# Patient Record
Sex: Female | Born: 1960 | Race: White | Hispanic: No | Smoking: Current every day smoker
Health system: Southern US, Community
[De-identification: ages and names within clinical notes are randomized; demographics above are authoritative.]

## PROBLEM LIST (undated history)

## (undated) DIAGNOSIS — J449 Chronic obstructive pulmonary disease, unspecified: Secondary | ICD-10-CM

## (undated) DIAGNOSIS — C801 Malignant (primary) neoplasm, unspecified: Secondary | ICD-10-CM

## (undated) DIAGNOSIS — I10 Essential (primary) hypertension: Secondary | ICD-10-CM

## (undated) HISTORY — PX: CHOLECYSTECTOMY: SHX55

---

## 2003-12-23 ENCOUNTER — Ambulatory Visit (HOSPITAL_COMMUNITY): Admission: RE | Admit: 2003-12-23 | Discharge: 2003-12-23 | Payer: Self-pay | Admitting: Family Medicine

## 2004-01-06 ENCOUNTER — Ambulatory Visit (HOSPITAL_COMMUNITY): Admission: RE | Admit: 2004-01-06 | Discharge: 2004-01-06 | Payer: Self-pay | Admitting: Family Medicine

## 2004-02-19 ENCOUNTER — Other Ambulatory Visit: Admission: RE | Admit: 2004-02-19 | Discharge: 2004-02-19 | Payer: Self-pay | Admitting: Obstetrics and Gynecology

## 2004-08-20 ENCOUNTER — Emergency Department (HOSPITAL_COMMUNITY): Admission: EM | Admit: 2004-08-20 | Discharge: 2004-08-20 | Payer: Self-pay | Admitting: Emergency Medicine

## 2008-02-08 ENCOUNTER — Emergency Department (HOSPITAL_COMMUNITY): Admission: EM | Admit: 2008-02-08 | Discharge: 2008-02-08 | Payer: Self-pay | Admitting: Emergency Medicine

## 2008-07-20 ENCOUNTER — Ambulatory Visit (HOSPITAL_COMMUNITY): Admission: RE | Admit: 2008-07-20 | Discharge: 2008-07-20 | Payer: Self-pay | Admitting: Family Medicine

## 2008-12-02 ENCOUNTER — Ambulatory Visit (HOSPITAL_COMMUNITY): Admission: RE | Admit: 2008-12-02 | Discharge: 2008-12-02 | Payer: Self-pay | Admitting: Family Medicine

## 2010-03-10 ENCOUNTER — Emergency Department (HOSPITAL_COMMUNITY): Admission: EM | Admit: 2010-03-10 | Discharge: 2010-03-11 | Payer: Self-pay | Admitting: Emergency Medicine

## 2010-10-02 ENCOUNTER — Encounter: Payer: Self-pay | Admitting: Family Medicine

## 2011-12-27 ENCOUNTER — Other Ambulatory Visit (HOSPITAL_COMMUNITY): Payer: Self-pay | Admitting: Family Medicine

## 2011-12-27 DIAGNOSIS — Z139 Encounter for screening, unspecified: Secondary | ICD-10-CM

## 2012-01-08 ENCOUNTER — Ambulatory Visit (HOSPITAL_COMMUNITY): Payer: Self-pay

## 2012-01-08 ENCOUNTER — Telehealth: Payer: Self-pay

## 2012-01-08 NOTE — Telephone Encounter (Signed)
LMOM to call.

## 2012-01-09 NOTE — Telephone Encounter (Signed)
LMOM to call.

## 2012-01-11 ENCOUNTER — Ambulatory Visit (HOSPITAL_COMMUNITY)
Admission: RE | Admit: 2012-01-11 | Discharge: 2012-01-11 | Disposition: A | Discharge status: Home / Self Care | Payer: 59 | Source: Ambulatory Visit | Attending: Family Medicine | Admitting: Family Medicine

## 2012-01-11 DIAGNOSIS — Z139 Encounter for screening, unspecified: Secondary | ICD-10-CM

## 2012-01-11 DIAGNOSIS — Z1231 Encounter for screening mammogram for malignant neoplasm of breast: Secondary | ICD-10-CM | POA: Insufficient documentation

## 2012-01-11 NOTE — Telephone Encounter (Signed)
Mailing a letter for pt to call. Letter to PCP also.

## 2015-01-27 ENCOUNTER — Ambulatory Visit (HOSPITAL_COMMUNITY)
Admission: RE | Admit: 2015-01-27 | Discharge: 2015-01-27 | Disposition: A | Discharge status: Home / Self Care | Payer: 59 | Source: Ambulatory Visit | Attending: Family Medicine | Admitting: Family Medicine

## 2015-01-27 ENCOUNTER — Other Ambulatory Visit (HOSPITAL_COMMUNITY): Payer: Self-pay | Admitting: Family Medicine

## 2015-01-27 DIAGNOSIS — M25511 Pain in right shoulder: Secondary | ICD-10-CM

## 2015-02-23 ENCOUNTER — Encounter: Payer: Self-pay | Admitting: *Deleted

## 2015-02-23 ENCOUNTER — Ambulatory Visit (INDEPENDENT_AMBULATORY_CARE_PROVIDER_SITE_OTHER): Payer: 59

## 2015-02-23 ENCOUNTER — Telehealth: Payer: Self-pay | Admitting: *Deleted

## 2015-02-23 ENCOUNTER — Other Ambulatory Visit: Payer: Self-pay | Admitting: *Deleted

## 2015-02-23 ENCOUNTER — Ambulatory Visit (INDEPENDENT_AMBULATORY_CARE_PROVIDER_SITE_OTHER): Payer: 59 | Admitting: Orthopedic Surgery

## 2015-02-23 VITALS — BP 162/88 | Ht 68.0 in | Wt 165.0 lb

## 2015-02-23 DIAGNOSIS — M542 Cervicalgia: Secondary | ICD-10-CM

## 2015-02-23 DIAGNOSIS — M25511 Pain in right shoulder: Secondary | ICD-10-CM | POA: Diagnosis not present

## 2015-02-23 MED ORDER — GABAPENTIN 100 MG PO CAPS: 100.0000 mg | ORAL_CAPSULE | Freq: Three times a day (TID) | ORAL | Status: DC

## 2015-02-23 MED ORDER — PREDNISONE 10 MG (48) PO TBPK: ORAL_TABLET | ORAL | Status: DC

## 2015-02-23 NOTE — Progress Notes (Signed)
The patient  Referred from [facility]  Pharmacy [facility]  Complaint right shoulder pain, throbbing 5 months  Injury history no injury  The patient does report a history of right shoulder pain treated with subacromial injection a proximally 10 years ago. She recovered easily and did well up until 5 months prior. 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 She has no weakness in the arm.  Review of systems night sweats vision problems anxiety joint pain  No allergies  Family history of heart disease and arthritis  She does smoke 1 pack per day she does drink maybe 2 drinks per week social history already described  Past medical history depression arthritis  The only operation is a cholecystectomy in 1989  Her medications are alprazolam hydrocodone and Centrum Silver.  She had an x-ray of her right shoulder 3 views that [facility] in those x-rays were normal with the exception of some sclerosis over the greater tuberosity.  That x-ray has been independently reviewed and I am reviewing that as normal  Our exam findings are as follows  Vital signs are: BP 162/88 mmHg  Ht 5\' 8"  (1.727 m)  Wt 165 lb (74.844 kg)  BMI 25.09 kg/m2  She has a small body habitus she is normally groomed hygiene is normal as well She is oriented 3 Normal mood some frustration from the constant pain and lack of relief  Ambulatory status is normal  Left and right shoulder have equal and normal strength and normal range of motion Right shoulder is stable There is no tenderness in the shoulder but her right trapezius muscle is tender and her cervical spine is tender as is her suprascapular fossa Skin in the head and neck area and the shoulder area and right upper extremity normal She has no sensory deficits normal sensation and no palpable  lymph nodes in the right axilla or supraclavicular region  Impression  It would be unlikely with this level of adequate treatment for bursitis or cuff syndrome that she would not get better, she will have a neck x-ray and then I will reinject the shoulder just to make sure we got a good injection and then start her on a Dosepak and some gabapentin  Cervical spine x-rays do so C5-6 C6-7 degenerative disc disease with joint space narrowing and anterior osteophytes  I think her problem is actually coming from her neck with cervical spondylosis  We will proceed with a Dosepak and gabapentin I did reinject the shoulder just to make sure I got a very good injection  Right subacromial injection  Procedure note the subacromial injection shoulder RIGHT  Verbal consent was obtained to inject the  RIGHT   Shoulder  Timeout was completed to confirm the injection site is a subacromial space of the  RIGHT  shoulder   Medication used Depo-Medrol 40 mg and lidocaine 1% 3 cc  Anesthesia was provided by ethyl chloride  The injection was performed in the RIGHT  posterior subacromial space. After pinning the skin with alcohol and anesthetized the skin with ethyl chloride the subacromial space was injected using a 20-gauge needle. There were no complications  Sterile dressing was applied.    I would discourage the use of Norco and opioids  in this setting.

## 2015-02-23 NOTE — Telephone Encounter (Signed)
This encounter was created in error - please disregard.

## 2015-02-23 NOTE — Telephone Encounter (Signed)
Requesting work note that states no heavy lifting until next appointment  Is this ok?

## 2015-02-23 NOTE — Patient Instructions (Signed)
Two new meds sent to your pharmacy  

## 2015-02-24 ENCOUNTER — Encounter: Payer: Self-pay | Admitting: Orthopedic Surgery

## 2015-04-20 ENCOUNTER — Ambulatory Visit (INDEPENDENT_AMBULATORY_CARE_PROVIDER_SITE_OTHER): Payer: 59 | Admitting: Orthopedic Surgery

## 2015-04-20 VITALS — BP 149/86 | Ht 68.0 in | Wt 172.0 lb

## 2015-04-20 DIAGNOSIS — M25511 Pain in right shoulder: Secondary | ICD-10-CM

## 2015-04-20 DIAGNOSIS — M75101 Unspecified rotator cuff tear or rupture of right shoulder, not specified as traumatic: Secondary | ICD-10-CM | POA: Diagnosis not present

## 2015-04-20 NOTE — Patient Instructions (Signed)
Joint Injection  Care After  Refer to this sheet in the next few days. These instructions provide you with information on caring for yourself after you have had a joint injection. Your caregiver also may give you more specific instructions. Your treatment has been planned according to current medical practices, but problems sometimes occur. Call your caregiver if you have any problems or questions after your procedure.  After any type of joint injection, it is not uncommon to experience:  · Soreness, swelling, or bruising around the injection site.  · Mild numbness, tingling, or weakness around the injection site caused by the numbing medicine used before or with the injection.  It also is possible to experience the following effects associated with the specific agent after injection:  · Iodine-based contrast agents:  ¨ Allergic reaction (itching, hives, widespread redness, and swelling beyond the injection site).  · Corticosteroids (These effects are rare.):  ¨ Allergic reaction.  ¨ Increased blood sugar levels (If you have diabetes and you notice that your blood sugar levels have increased, notify your caregiver).  ¨ Increased blood pressure levels.  ¨ Mood swings.  · Hyaluronic acid in the use of viscosupplementation.  ¨ Temporary heat or redness.  ¨ Temporary rash and itching.  ¨ Increased fluid accumulation in the injected joint.  These effects all should resolve within a day after your procedure.   HOME CARE INSTRUCTIONS  · Limit yourself to light activity the day of your procedure. Avoid lifting heavy objects, bending, stooping, or twisting.  · Take prescription or over-the-counter pain medication as directed by your caregiver.  · You may apply ice to your injection site to reduce pain and swelling the day of your procedure. Ice may be applied 03-04 times:  ¨ Put ice in a plastic bag.  ¨ Place a towel between your skin and the bag.  ¨ Leave the ice on for no longer than 15-20 minutes each time.  SEEK  IMMEDIATE MEDICAL CARE IF:   · Pain and swelling get worse rather than better or extend beyond the injection site.  · Numbness does not go away.  · Blood or fluid continues to leak from the injection site.  · You have chest pain.  · You have swelling of your face or tongue.  · You have trouble breathing or you become dizzy.  · You develop a fever, chills, or severe tenderness at the injection site that last longer than 1 day.  MAKE SURE YOU:  · Understand these instructions.  · Watch your condition.  · Get help right away if you are not doing well or if you get worse.  Document Released: 05/11/2011 Document Revised: 11/20/2011 Document Reviewed: 05/11/2011  ExitCare® Patient Information ©2015 [employer]. This information is not intended to replace advice given to you by your health care provider. Make sure you discuss any questions you have with your health care provider.

## 2015-04-20 NOTE — Progress Notes (Signed)
Patient ID: Tara Morgan, female   DOB: Sep 14, 1960, 54 y.o.   MRN: [id number]  Follow up visit  Chief Complaint  Patient presents with  . Follow-up    6 week follow up right shoulder s/p injection + meds    BP 149/86 mmHg  Ht  (1.727 m)  Wt 172 lb (78.019 kg)  BMI 26.16 kg/m2  No diagnosis found.  Status post injection for rotator cuff syndrome patient says she got 3 weeks ago pain relief and then the pain started to come back but she has improved  Review of systems denies neck pain denies radicular symptoms denies numbness or tingling  Vital signs are stable as recorded she is oriented 3 mood and affect are normal she is well-groomed well-developed well-nourished no deformity  She has good strength in her rotator cuff no instability impingement sign is mild  Neurovascular exam intact  Rotator cuff syndrome  Recommend second injection in home exercise program  Follow-up 3 months   Procedure note the subacromial injection shoulder RIGHT  Verbal consent was obtained to inject the  RIGHT   Shoulder  Timeout was completed to confirm the injection site is a subacromial space of the  RIGHT  shoulder   Medication used Depo-Medrol 40 mg and lidocaine 1% 3 cc  Anesthesia was provided by ethyl chloride  The injection was performed in the RIGHT  posterior subacromial space. After pinning the skin with alcohol and anesthetized the skin with ethyl chloride the subacromial space was injected using a 20-gauge needle. There were no complications  Sterile dressing was applied.

## 2015-07-22 ENCOUNTER — Ambulatory Visit: Payer: 59 | Admitting: Orthopedic Surgery

## 2015-11-09 DIAGNOSIS — H52223 Regular astigmatism, bilateral: Secondary | ICD-10-CM | POA: Diagnosis not present

## 2015-11-09 DIAGNOSIS — H5203 Hypermetropia, bilateral: Secondary | ICD-10-CM | POA: Diagnosis not present

## 2015-11-09 DIAGNOSIS — H524 Presbyopia: Secondary | ICD-10-CM | POA: Diagnosis not present

## 2015-12-09 DIAGNOSIS — H25091 Other age-related incipient cataract, right eye: Secondary | ICD-10-CM | POA: Diagnosis not present

## 2015-12-09 DIAGNOSIS — H25812 Combined forms of age-related cataract, left eye: Secondary | ICD-10-CM | POA: Diagnosis not present

## 2015-12-09 DIAGNOSIS — H5203 Hypermetropia, bilateral: Secondary | ICD-10-CM | POA: Diagnosis not present

## 2015-12-29 NOTE — Patient Instructions (Signed)
Your procedure is scheduled on: 01/03/2016  Report to [facility] at 730  AM.  Call this number if you have problems the morning of surgery: [phone]   Do not eat food or drink liquids :After Midnight.      Take these medicines the morning of surgery with A SIP OF WATER: xanax   Do not wear jewelry, make-up or nail polish.  Do not wear lotions, powders, or perfumes. You may wear deodorant.  Do not shave 48 hours prior to surgery.  Do not bring valuables to the hospital.  Contacts, dentures or bridgework may not be worn into surgery.  Leave suitcase in the car. After surgery it may be brought to your room.  For patients admitted to the hospital, checkout time is 11:00 AM the day of discharge.   Patients discharged the day of surgery will not be allowed to drive home.  :     Please read over the following fact sheets that you were given: Coughing and Deep Breathing, Surgical Site Infection Prevention, Anesthesia Post-op Instructions and Care and Recovery After Surgery    Cataract A cataract is a clouding of the lens of the eye. When a lens becomes cloudy, vision is reduced based on the degree and nature of the clouding. Many cataracts reduce vision to some degree. Some cataracts make people more near-sighted as they develop. Other cataracts increase glare. Cataracts that are ignored and become worse can sometimes look white. The white color can be seen through the pupil. CAUSES   Aging. However, cataracts may occur at any age, even in newborns.   Certain drugs.   Trauma to the eye.   Certain diseases such as diabetes.   Specific eye diseases such as chronic inflammation inside the eye or a sudden attack of a rare form of glaucoma.   Inherited or acquired medical problems.  SYMPTOMS   Gradual, progressive drop in vision in the affected eye.   Severe, rapid visual loss. This most often happens when trauma is the cause.  DIAGNOSIS  To detect a cataract, an eye doctor examines  the lens. Cataracts are best diagnosed with an exam of the eyes with the pupils enlarged (dilated) by drops.  TREATMENT  For an early cataract, vision may improve by using different eyeglasses or stronger lighting. If that does not help your vision, surgery is the only effective treatment. A cataract needs to be surgically removed when vision loss interferes with your everyday activities, such as driving, reading, or watching TV. A cataract may also have to be removed if it prevents examination or treatment of another eye problem. Surgery removes the cloudy lens and usually replaces it with a substitute lens (intraocular lens, IOL).  At a time when both you and your doctor agree, the cataract will be surgically removed. If you have cataracts in both eyes, only one is usually removed at a time. This allows the operated eye to heal and be out of danger from any possible problems after surgery (such as infection or poor wound healing). In rare cases, a cataract may be doing damage to your eye. In these cases, your caregiver may advise surgical removal right away. The vast majority of people who have cataract surgery have better vision afterward. HOME CARE INSTRUCTIONS  If you are not planning surgery, you may be asked to do the following:  Use different eyeglasses.   Use stronger or brighter lighting.   Ask your eye doctor about reducing your medicine dose or changing  medicines if it is thought that a medicine caused your cataract. Changing medicines does not make the cataract go away on its own.   Become familiar with your surroundings. Poor vision can lead to injury. Avoid bumping into things on the affected side. You are at a higher risk for tripping or falling.   Exercise extreme care when driving or operating machinery.   Wear sunglasses if you are sensitive to bright light or experiencing problems with glare.  SEEK IMMEDIATE MEDICAL CARE IF:   You have a worsening or sudden vision loss.    You notice redness, swelling, or increasing pain in the eye.   You have a fever.  Document Released: 08/28/2005 Document Revised: 08/17/2011 Document Reviewed: 04/21/2011 [facility] Patient Information 2012 [place].PATIENT INSTRUCTIONS POST-ANESTHESIA  IMMEDIATELY FOLLOWING SURGERY:  Do not drive or operate machinery for the first twenty four hours after surgery.  Do not make any important decisions for twenty four hours after surgery or while taking narcotic pain medications or sedatives.  If you develop intractable nausea and vomiting or a severe headache please notify your doctor immediately.  FOLLOW-UP:  Please make an appointment with your surgeon as instructed. You do not need to follow up with anesthesia unless specifically instructed to do so.  WOUND CARE INSTRUCTIONS (if applicable):  Keep a dry clean dressing on the anesthesia/puncture wound site if there is drainage.  Once the wound has quit draining you may leave it open to air.  Generally you should leave the bandage intact for twenty four hours unless there is drainage.  If the epidural site drains for more than 36-48 hours please call the anesthesia department.  QUESTIONS?:  Please feel free to call your physician or the hospital operator if you have any questions, and they will be happy to assist you.

## 2015-12-30 ENCOUNTER — Encounter (HOSPITAL_COMMUNITY)
Admission: RE | Admit: 2015-12-30 | Discharge: 2015-12-30 | Disposition: A | Discharge status: Home / Self Care | Payer: 59 | Source: Ambulatory Visit | Attending: Ophthalmology | Admitting: Ophthalmology

## 2015-12-30 ENCOUNTER — Encounter (HOSPITAL_COMMUNITY): Payer: Self-pay

## 2015-12-30 DIAGNOSIS — Z01812 Encounter for preprocedural laboratory examination: Secondary | ICD-10-CM | POA: Insufficient documentation

## 2015-12-30 LAB — BASIC METABOLIC PANEL
Anion gap: 9 (ref 5–15)
BUN: 12 mg/dL (ref 6–20)
CHLORIDE: 108 mmol/L (ref 101–111)
CO2: 23 mmol/L (ref 22–32)
Calcium: 9.3 mg/dL (ref 8.9–10.3)
Creatinine, Ser: 0.72 mg/dL (ref 0.44–1.00)
GFR calc Af Amer: >60 mL/min (ref >60)
GFR calc non Af Amer: >60 mL/min (ref >60)
Glucose, Bld: 140 mg/dL — ABNORMAL HIGH (ref 65–99)
POTASSIUM: 3.8 mmol/L (ref 3.5–5.1)
SODIUM: 140 mmol/L (ref 135–145)

## 2015-12-30 LAB — CBC WITH DIFFERENTIAL/PLATELET
Basophils Absolute: 0 10*3/uL (ref 0.0–0.1)
Basophils Relative: 0 %
EOS ABS: 0.1 10*3/uL (ref 0.0–0.7)
Eosinophils Relative: 2 %
HEMATOCRIT: 39.4 % (ref 36.0–46.0)
HEMOGLOBIN: 13.6 g/dL (ref 12.0–15.0)
LYMPHS ABS: 2.1 10*3/uL (ref 0.7–4.0)
LYMPHS PCT: 37 %
MCH: 36 pg — AB (ref 26.0–34.0)
MCHC: 34.5 g/dL (ref 30.0–36.0)
MCV: 104.2 fL — ABNORMAL HIGH (ref 78.0–100.0)
MONOS PCT: 5 %
Monocytes Absolute: 0.3 10*3/uL (ref 0.1–1.0)
NEUTROS ABS: 3.2 10*3/uL (ref 1.7–7.7)
NEUTROS PCT: 56 %
Platelets: 234 10*3/uL (ref 150–400)
RBC: 3.78 MIL/uL — AB (ref 3.87–5.11)
RDW: 13.7 % (ref 11.5–15.5)
WBC: 5.7 10*3/uL (ref 4.0–10.5)

## 2015-12-30 NOTE — Pre-Procedure Instructions (Signed)
Patient given information to sign up for my chart at home. 

## 2016-01-03 ENCOUNTER — Ambulatory Visit (HOSPITAL_COMMUNITY): Payer: 59 | Admitting: Anesthesiology

## 2016-01-03 ENCOUNTER — Ambulatory Visit (HOSPITAL_COMMUNITY)
Admission: RE | Admit: 2016-01-03 | Discharge: 2016-01-03 | Disposition: A | Discharge status: Home / Self Care | Payer: 59 | Source: Ambulatory Visit | Attending: Ophthalmology | Admitting: Ophthalmology

## 2016-01-03 ENCOUNTER — Encounter (HOSPITAL_COMMUNITY): Payer: Self-pay

## 2016-01-03 ENCOUNTER — Encounter (HOSPITAL_COMMUNITY)
Admission: RE | Disposition: A | Discharge status: Home / Self Care | Payer: Self-pay | Source: Ambulatory Visit | Attending: Ophthalmology

## 2016-01-03 DIAGNOSIS — H25812 Combined forms of age-related cataract, left eye: Secondary | ICD-10-CM | POA: Diagnosis not present

## 2016-01-03 DIAGNOSIS — M1991 Primary osteoarthritis, unspecified site: Secondary | ICD-10-CM | POA: Insufficient documentation

## 2016-01-03 DIAGNOSIS — H269 Unspecified cataract: Secondary | ICD-10-CM | POA: Diagnosis not present

## 2016-01-03 DIAGNOSIS — F17200 Nicotine dependence, unspecified, uncomplicated: Secondary | ICD-10-CM | POA: Insufficient documentation

## 2016-01-03 HISTORY — PX: CATARACT EXTRACTION W/PHACO: SHX586

## 2016-01-03 SURGERY — PHACOEMULSIFICATION, CATARACT, WITH IOL INSERTION
Anesthesia: Monitor Anesthesia Care | Site: Eye | Laterality: Left

## 2016-01-03 MED ORDER — POVIDONE-IODINE 5 % OP SOLN
OPHTHALMIC | Status: DC | PRN
  Administered 2016-01-03: 1 via OPHTHALMIC

## 2016-01-03 MED ORDER — NEOMYCIN-POLYMYXIN-DEXAMETH 3.5-10000-0.1 OP SUSP
OPHTHALMIC | Status: DC | PRN
  Administered 2016-01-03: 2 [drp] via OPHTHALMIC

## 2016-01-03 MED ORDER — EPINEPHRINE HCL 1 MG/ML IJ SOLN
INTRAMUSCULAR | Status: AC
  Filled 2016-01-03: qty 1

## 2016-01-03 MED ORDER — PHENYLEPHRINE HCL 2.5 % OP SOLN
1.0000 [drp] | OPHTHALMIC | Status: AC
  Administered 2016-01-03 (×3): 1 [drp] via OPHTHALMIC

## 2016-01-03 MED ORDER — MIDAZOLAM HCL 2 MG/2ML IJ SOLN
INTRAMUSCULAR | Status: AC
  Filled 2016-01-03: qty 2

## 2016-01-03 MED ORDER — PROVISC 10 MG/ML IO SOLN
INTRAOCULAR | Status: DC | PRN
  Administered 2016-01-03: 0.85 mL via INTRAOCULAR

## 2016-01-03 MED ORDER — LIDOCAINE 3.5 % OP GEL OPTIME - NO CHARGE
OPHTHALMIC | Status: DC | PRN
  Administered 2016-01-03: 1 [drp] via OPHTHALMIC

## 2016-01-03 MED ORDER — MIDAZOLAM HCL 2 MG/2ML IJ SOLN
1.0000 mg | INTRAMUSCULAR | Status: DC | PRN
  Administered 2016-01-03 (×2): 2 mg via INTRAVENOUS
  Filled 2016-01-03: qty 2

## 2016-01-03 MED ORDER — FENTANYL CITRATE (PF) 100 MCG/2ML IJ SOLN
25.0000 ug | INTRAMUSCULAR | Status: AC
  Administered 2016-01-03 (×2): 25 ug via INTRAVENOUS

## 2016-01-03 MED ORDER — CYCLOPENTOLATE-PHENYLEPHRINE 0.2-1 % OP SOLN
1.0000 [drp] | OPHTHALMIC | Status: AC
  Administered 2016-01-03 (×3): 1 [drp] via OPHTHALMIC

## 2016-01-03 MED ORDER — LACTATED RINGERS IV SOLN
INTRAVENOUS | Status: DC
  Administered 2016-01-03: 08:00:00 via INTRAVENOUS

## 2016-01-03 MED ORDER — LIDOCAINE HCL (PF) 1 % IJ SOLN
INTRAMUSCULAR | Status: DC | PRN
  Administered 2016-01-03: .5 mL

## 2016-01-03 MED ORDER — BSS IO SOLN
INTRAOCULAR | Status: DC | PRN
  Administered 2016-01-03: 15 mL

## 2016-01-03 MED ORDER — LIDOCAINE HCL 3.5 % OP GEL: 1.0000 "application " | Freq: Once | OPHTHALMIC | Status: DC

## 2016-01-03 MED ORDER — FENTANYL CITRATE (PF) 100 MCG/2ML IJ SOLN
INTRAMUSCULAR | Status: AC
  Filled 2016-01-03: qty 2

## 2016-01-03 MED ORDER — EPINEPHRINE HCL 1 MG/ML IJ SOLN
INTRAMUSCULAR | Status: DC | PRN
  Administered 2016-01-03: 500 mL

## 2016-01-03 MED ORDER — TETRACAINE HCL 0.5 % OP SOLN
1.0000 [drp] | OPHTHALMIC | Status: AC
  Administered 2016-01-03 (×3): 1 [drp] via OPHTHALMIC

## 2016-01-03 SURGICAL SUPPLY — 11 items
CLOTH BEACON ORANGE TIMEOUT ST (SAFETY) ×1 IMPLANT
EYE SHIELD UNIVERSAL CLEAR (GAUZE/BANDAGES/DRESSINGS) ×1 IMPLANT
GLOVE BIOGEL PI IND STRL 7.0 (GLOVE) IMPLANT
GLOVE BIOGEL PI INDICATOR 7.0 (GLOVE) ×1
GLOVE EXAM NITRILE MD LF STRL (GLOVE) ×1 IMPLANT
PAD ARMBOARD 7.5X6 YLW CONV (MISCELLANEOUS) ×1 IMPLANT
SIGHTPATH CAT PROC W REG LENS (Ophthalmic Related) ×2 IMPLANT
SYRINGE LUER LOK 1CC (MISCELLANEOUS) ×1 IMPLANT
TAPE SURG TRANSPORE 1 IN (GAUZE/BANDAGES/DRESSINGS) IMPLANT
TAPE SURGICAL TRANSPORE 1 IN (GAUZE/BANDAGES/DRESSINGS) ×1
WATER STERILE IRR 250ML POUR (IV SOLUTION) ×1 IMPLANT

## 2016-01-03 NOTE — Anesthesia Postprocedure Evaluation (Signed)
Anesthesia Post Note  Patient: Tara Morgan  Procedure(s) Performed: Procedure(s) (LRB): CATARACT EXTRACTION PHACO AND INTRAOCULAR LENS PLACEMENT (IOC) (Left)  Patient location during evaluation: Short Stay Anesthesia Type: MAC Level of consciousness: awake and alert and oriented Pain management: pain level controlled Vital Signs Assessment: post-procedure vital signs reviewed and stable Respiratory status: spontaneous breathing Postop Assessment: no signs of nausea or vomiting Anesthetic complications: no    Last Vitals:  Filed Vitals:   01/03/16 0738 01/03/16 0741  BP:  184/86  Pulse:  55  Temp: 36.7 C   Resp:  16    Last Pain: There were no vitals filed for this visit.               Nigil Braman A

## 2016-01-03 NOTE — H&P (Signed)
I have reviewed the H&P, the patient was re-examined, and I have identified no interval changes in medical condition and plan of care since the history and physical of record  

## 2016-01-03 NOTE — Anesthesia Preprocedure Evaluation (Signed)
Anesthesia Evaluation  Patient identified by MRN, date of birth, ID band Patient awake    Reviewed: Allergy & Precautions, NPO status , Patient's Chart, lab work & pertinent test results  Airway Mallampati: II  TM Distance: >3 FB Neck ROM: Full    Dental  (+) Edentulous Upper   Pulmonary Current Smoker,    breath sounds clear to auscultation       Cardiovascular negative cardio ROS   Rhythm:Regular Rate:Normal     Neuro/Psych    GI/Hepatic negative GI ROS,   Endo/Other    Renal/GU      Musculoskeletal   Abdominal   Peds  Hematology   Anesthesia Other Findings   Reproductive/Obstetrics                             Anesthesia Physical Anesthesia Plan  ASA: II  Anesthesia Plan: MAC   Post-op Pain Management:    Induction: Intravenous  Airway Management Planned: Nasal Cannula  Additional Equipment:   Intra-op Plan:   Post-operative Plan:   Informed Consent: I have reviewed the patients History and Physical, chart, labs and discussed the procedure including the risks, benefits and alternatives for the proposed anesthesia with the patient or authorized representative who has indicated his/her understanding and acceptance.     Plan Discussed with:   Anesthesia Plan Comments:         Anesthesia Quick Evaluation

## 2016-01-03 NOTE — Anesthesia Procedure Notes (Signed)
Procedure Name: MAC Date/Time: 01/03/2016 9:04 AM Performed by: Pernell DupreADAMS, Tara Mantione A Pre-anesthesia Checklist: Patient identified, Timeout performed, Emergency Drugs available, Suction available and Patient being monitored Patient Re-evaluated:Patient Re-evaluated prior to inductionOxygen Delivery Method: Nasal cannula

## 2016-01-03 NOTE — Transfer of Care (Signed)
Immediate Anesthesia Transfer of Care Note  Patient: Tara HomesMary K Noga  Procedure(s) Performed: Procedure(s) with comments: CATARACT EXTRACTION PHACO AND INTRAOCULAR LENS PLACEMENT (IOC) (Left) - CDE:5.86  Patient Location: Short stay  Anesthesia Type:MAC  Level of Consciousness: awake, alert , oriented and patient cooperative  Airway & Oxygen Therapy: Patient Spontanous Breathing  Post-op Assessment: Report given to RN and Post -op Vital signs reviewed and stable  Post vital signs: Reviewed and stable  Last Vitals:  Filed Vitals:   01/03/16 0738 01/03/16 0741  BP:  184/86  Pulse:  55  Temp: 36.7 C   Resp:  16    Complications: No apparent anesthesia complications

## 2016-01-03 NOTE — Op Note (Signed)
Date of Admission: 01/03/2016  Date of Surgery: 01/03/2016   Pre-Op Dx: Cataract Left Eye  Post-Op Dx: Senile Combined Cataract Left  Eye,  Dx Code Z61.096H25.812  Surgeon: Gemma PayorKerry Aela Bohan, M.D.  Assistants: None  Anesthesia: Topical with MAC  Indications: Painless, progressive loss of vision with compromise of daily activities.  Surgery: Cataract Extraction with Intraocular lens Implant Left Eye  Discription: The patient had dilating drops and viscous lidocaine placed into the Left eye in the pre-op holding area. After transfer to the operating room, a time out was performed. The patient was then prepped and draped. Beginning with a 75 degree blade a paracentesis port was made at the surgeon's 2 o'clock position. The anterior chamber was then filled with 1% non-preserved lidocaine. This was followed by filling the anterior chamber with Provisc.  A 2.374mm keratome blade was used to make a clear corneal incision at the temporal limbus.  A bent cystatome needle was used to create a continuous tear capsulotomy. Hydrodissection was performed with balanced salt solution on a Fine canula. The lens nucleus was then removed using the phacoemulsification handpiece. Residual cortex was removed with the I&A handpiece. The anterior chamber and capsular bag were refilled with Provisc. A posterior chamber intraocular lens was placed into the capsular bag with it's injector. The implant was positioned with the Kuglan hook. The Provisc was then removed from the anterior chamber and capsular bag with the I&A handpiece. Stromal hydration of the main incision and paracentesis port was performed with BSS on a Fine canula. The wounds were tested for leak which was negative. The patient tolerated the procedure well. There were no operative complications. The patient was then transferred to the recovery room in stable condition.  Complications: None  Specimen: None  EBL: None  Prosthetic device: Hoya iSert 250, power 23.0 D, SN  [id number].

## 2016-01-04 ENCOUNTER — Encounter (HOSPITAL_COMMUNITY): Payer: Self-pay | Admitting: Ophthalmology

## 2016-01-31 DIAGNOSIS — H2511 Age-related nuclear cataract, right eye: Secondary | ICD-10-CM | POA: Diagnosis not present

## 2016-01-31 DIAGNOSIS — H5203 Hypermetropia, bilateral: Secondary | ICD-10-CM | POA: Diagnosis not present

## 2016-01-31 DIAGNOSIS — Z961 Presence of intraocular lens: Secondary | ICD-10-CM | POA: Diagnosis not present

## 2016-02-15 ENCOUNTER — Encounter (HOSPITAL_COMMUNITY): Payer: Self-pay

## 2016-02-16 ENCOUNTER — Encounter (HOSPITAL_COMMUNITY)
Admission: RE | Admit: 2016-02-16 | Discharge: 2016-02-16 | Disposition: A | Discharge status: Home / Self Care | Payer: 59 | Source: Ambulatory Visit | Attending: Ophthalmology | Admitting: Ophthalmology

## 2016-02-21 ENCOUNTER — Encounter (HOSPITAL_COMMUNITY)
Admission: RE | Disposition: A | Discharge status: Home / Self Care | Payer: Self-pay | Source: Ambulatory Visit | Attending: Ophthalmology

## 2016-02-21 ENCOUNTER — Ambulatory Visit (HOSPITAL_COMMUNITY)
Admission: RE | Admit: 2016-02-21 | Discharge: 2016-02-21 | Disposition: A | Discharge status: Home / Self Care | Payer: 59 | Source: Ambulatory Visit | Attending: Ophthalmology | Admitting: Ophthalmology

## 2016-02-21 ENCOUNTER — Ambulatory Visit (HOSPITAL_COMMUNITY): Payer: 59 | Admitting: Anesthesiology

## 2016-02-21 ENCOUNTER — Encounter (HOSPITAL_COMMUNITY): Payer: Self-pay | Admitting: *Deleted

## 2016-02-21 DIAGNOSIS — F17200 Nicotine dependence, unspecified, uncomplicated: Secondary | ICD-10-CM | POA: Diagnosis not present

## 2016-02-21 DIAGNOSIS — H269 Unspecified cataract: Secondary | ICD-10-CM | POA: Diagnosis not present

## 2016-02-21 DIAGNOSIS — H2511 Age-related nuclear cataract, right eye: Secondary | ICD-10-CM | POA: Insufficient documentation

## 2016-02-21 HISTORY — PX: CATARACT EXTRACTION W/PHACO: SHX586

## 2016-02-21 SURGERY — PHACOEMULSIFICATION, CATARACT, WITH IOL INSERTION
Anesthesia: Monitor Anesthesia Care | Site: Eye | Laterality: Right

## 2016-02-21 MED ORDER — LACTATED RINGERS IV SOLN
INTRAVENOUS | Status: DC
  Administered 2016-02-21: 07:00:00 via INTRAVENOUS

## 2016-02-21 MED ORDER — LIDOCAINE HCL 3.5 % OP GEL
1.0000 "application " | Freq: Once | OPHTHALMIC | Status: AC
  Administered 2016-02-21: 1 via OPHTHALMIC

## 2016-02-21 MED ORDER — CYCLOPENTOLATE-PHENYLEPHRINE 0.2-1 % OP SOLN
1.0000 [drp] | OPHTHALMIC | Status: AC
  Administered 2016-02-21 (×3): 1 [drp] via OPHTHALMIC

## 2016-02-21 MED ORDER — FENTANYL CITRATE (PF) 100 MCG/2ML IJ SOLN
25.0000 ug | INTRAMUSCULAR | Status: AC | PRN
  Administered 2016-02-21 (×2): 25 ug via INTRAVENOUS

## 2016-02-21 MED ORDER — POVIDONE-IODINE 5 % OP SOLN
OPHTHALMIC | Status: DC | PRN
  Administered 2016-02-21: 1 via OPHTHALMIC

## 2016-02-21 MED ORDER — FENTANYL CITRATE (PF) 100 MCG/2ML IJ SOLN
INTRAMUSCULAR | Status: AC
  Filled 2016-02-21: qty 2

## 2016-02-21 MED ORDER — PROVISC 10 MG/ML IO SOLN
INTRAOCULAR | Status: DC | PRN
  Administered 2016-02-21: 0.85 mL via INTRAOCULAR

## 2016-02-21 MED ORDER — EPINEPHRINE HCL 1 MG/ML IJ SOLN
INTRAOCULAR | Status: DC | PRN
  Administered 2016-02-21: 500 mL

## 2016-02-21 MED ORDER — PHENYLEPHRINE HCL 2.5 % OP SOLN
1.0000 [drp] | OPHTHALMIC | Status: AC
Start: 2016-02-21 — End: 2016-02-21
  Administered 2016-02-21 (×3): 1 [drp] via OPHTHALMIC

## 2016-02-21 MED ORDER — NEOMYCIN-POLYMYXIN-DEXAMETH 3.5-10000-0.1 OP SUSP
OPHTHALMIC | Status: DC | PRN
  Administered 2016-02-21: 1 [drp] via OPHTHALMIC

## 2016-02-21 MED ORDER — LIDOCAINE HCL (PF) 1 % IJ SOLN
INTRAMUSCULAR | Status: DC | PRN
  Administered 2016-02-21: .3 mL

## 2016-02-21 MED ORDER — BSS IO SOLN
INTRAOCULAR | Status: DC | PRN
  Administered 2016-02-21: 15 mL via INTRAOCULAR

## 2016-02-21 MED ORDER — TETRACAINE HCL 0.5 % OP SOLN
1.0000 [drp] | OPHTHALMIC | Status: AC
  Administered 2016-02-21 (×3): 1 [drp] via OPHTHALMIC

## 2016-02-21 MED ORDER — EPINEPHRINE HCL 1 MG/ML IJ SOLN
INTRAMUSCULAR | Status: AC
  Filled 2016-02-21: qty 1

## 2016-02-21 MED ORDER — MIDAZOLAM HCL 2 MG/2ML IJ SOLN
INTRAMUSCULAR | Status: AC
  Filled 2016-02-21: qty 2

## 2016-02-21 MED ORDER — MIDAZOLAM HCL 2 MG/2ML IJ SOLN
1.0000 mg | INTRAMUSCULAR | Status: DC | PRN
  Administered 2016-02-21 (×2): 2 mg via INTRAVENOUS
  Filled 2016-02-21: qty 2

## 2016-02-21 MED ORDER — LIDOCAINE 3.5 % OP GEL OPTIME - NO CHARGE
OPHTHALMIC | Status: DC | PRN
  Administered 2016-02-21: 1 [drp] via OPHTHALMIC

## 2016-02-21 SURGICAL SUPPLY — 23 items
CAPSULAR TENSION RING-AMO (OPHTHALMIC RELATED) IMPLANT
CLOTH BEACON ORANGE TIMEOUT ST (SAFETY) ×1 IMPLANT
EYE SHIELD UNIVERSAL CLEAR (GAUZE/BANDAGES/DRESSINGS) ×1 IMPLANT
GLOVE BIOGEL PI IND STRL 7.0 (GLOVE) IMPLANT
GLOVE BIOGEL PI IND STRL 7.5 (GLOVE) IMPLANT
GLOVE BIOGEL PI INDICATOR 7.0 (GLOVE) ×1
GLOVE BIOGEL PI INDICATOR 7.5 (GLOVE)
GLOVE EXAM NITRILE LRG STRL (GLOVE) IMPLANT
GLOVE EXAM NITRILE MD LF STRL (GLOVE) ×1 IMPLANT
KIT VITRECTOMY (OPHTHALMIC RELATED) IMPLANT
PAD ARMBOARD 7.5X6 YLW CONV (MISCELLANEOUS) ×1 IMPLANT
PROC W NO LENS (INTRAOCULAR LENS)
PROC W SPEC LENS (INTRAOCULAR LENS)
PROCESS W NO LENS (INTRAOCULAR LENS) IMPLANT
PROCESS W SPEC LENS (INTRAOCULAR LENS) IMPLANT
RETRACTOR IRIS SIGHTPATH (OPHTHALMIC RELATED) IMPLANT
RING MALYGIN (MISCELLANEOUS) IMPLANT
SIGHTPATH CAT PROC W REG LENS (Ophthalmic Related) ×2 IMPLANT
SYRINGE LUER LOK 1CC (MISCELLANEOUS) ×1 IMPLANT
TAPE SURG TRANSPORE 1 IN (GAUZE/BANDAGES/DRESSINGS) IMPLANT
TAPE SURGICAL TRANSPORE 1 IN (GAUZE/BANDAGES/DRESSINGS) ×1
VISCOELASTIC ADDITIONAL (OPHTHALMIC RELATED) IMPLANT
WATER STERILE IRR 250ML POUR (IV SOLUTION) ×1 IMPLANT

## 2016-02-21 NOTE — Transfer of Care (Signed)
Immediate Anesthesia Transfer of Care Note  Patient: Tara Morgan  Procedure(s) Performed: Procedure(s): CATARACT EXTRACTION PHACO AND INTRAOCULAR LENS PLACEMENT RIGHT EYE; CDE:  6.40 (Right)  Patient Location: Short Stay  Anesthesia Type:MAC  Level of Consciousness: awake  Airway & Oxygen Therapy: Patient Spontanous Breathing  Post-op Assessment: Report given to RN  Post vital signs: Reviewed  Last Vitals:  Filed Vitals:   02/21/16 0825 02/21/16 0830  BP: 128/81 78/50  Pulse:    Temp:    Resp: 28 24    Last Pain: There were no vitals filed for this visit.    Patients Stated Pain Goal: 6 (02/21/16 [id number])  Complications: No apparent anesthesia complications

## 2016-02-21 NOTE — Anesthesia Preprocedure Evaluation (Signed)
Anesthesia Evaluation  Patient identified by MRN, date of birth, ID band Patient awake    Reviewed: Allergy & Precautions, NPO status , Patient's Chart, lab work & pertinent test results  Airway Mallampati: II  TM Distance: >3 FB Neck ROM: Full    Dental  (+) Edentulous Upper   Pulmonary Current Smoker,    breath sounds clear to auscultation       Cardiovascular negative cardio ROS   Rhythm:Regular Rate:Normal     Neuro/Psych    GI/Hepatic negative GI ROS,   Endo/Other    Renal/GU      Musculoskeletal   Abdominal   Peds  Hematology   Anesthesia Other Findings   Reproductive/Obstetrics                             Anesthesia Physical Anesthesia Plan  ASA: II  Anesthesia Plan: MAC   Post-op Pain Management:    Induction: Intravenous  Airway Management Planned: Nasal Cannula  Additional Equipment:   Intra-op Plan:   Post-operative Plan:   Informed Consent: I have reviewed the patients History and Physical, chart, labs and discussed the procedure including the risks, benefits and alternatives for the proposed anesthesia with the patient or authorized representative who has indicated his/her understanding and acceptance.     Plan Discussed with:   Anesthesia Plan Comments:         Anesthesia Quick Evaluation

## 2016-02-21 NOTE — Addendum Note (Signed)
Addendum  created 02/21/16 [id number] by Moshe SalisburyKaren E Hendrix Console, CRNA   Modules edited: Charges VN

## 2016-02-21 NOTE — Discharge Instructions (Signed)
Cataract Surgery, Care After °Refer to this sheet in the next few weeks. These instructions provide you with information on caring for yourself after your procedure. Your caregiver may also give you more specific instructions. Your treatment has been planned according to current medical practices, but problems sometimes occur. Call your caregiver if you have any problems or questions after your procedure.  °HOME CARE INSTRUCTIONS  °· Avoid strenuous activities as directed by your caregiver. °· Ask your caregiver when you can resume driving. °· Use eyedrops or other medicines to help healing and control pressure inside your eye as directed by your caregiver. °· Only take over-the-counter or prescription medicines for pain, discomfort, or fever as directed by your caregiver. °· Do not to touch or rub your eyes. °· You may be instructed to use a protective shield during the first few days and nights after surgery. If not, wear sunglasses to protect your eyes. This is to protect the eye from pressure or from being accidentally bumped. °· Keep the area around your eye clean and dry. Avoid swimming or allowing water to hit you directly in the face while showering. Keep soap and shampoo out of your eyes. °· Do not bend or lift heavy objects. Bending increases pressure in the eye. You can walk, climb stairs, and do light household chores. °· Do not put a contact lens into the eye that had surgery until your caregiver says it is okay to do so. °· Ask your doctor when you can return to work. This will depend on the kind of work that you do. If you work in a dusty environment, you may be advised to wear protective eyewear for a period of time. °· Ask your caregiver when it will be safe to engage in sexual activity. °· Continue with your regular eye exams as directed by your caregiver. °What to expect: °· It is normal to feel itching and mild discomfort for a few days after cataract surgery. Some fluid discharge is also common,  and your eye may be sensitive to light and touch. °· After 1 to 2 days, even moderate discomfort should disappear. In most cases, healing will take about 6 weeks. °· If you received an intraocular lens (IOL), you may notice that colors are very bright or have a blue tinge. Also, if you have been in bright sunlight, everything may appear reddish for a few hours. If you see these color tinges, it is because your lens is clear and no longer cloudy. Within a few months after receiving an IOL, these extra colors should go away. When you have healed, you will probably need new glasses. °SEEK MEDICAL CARE IF:  °· You have increased bruising around your eye. °· You have discomfort not helped by medicine. °SEEK IMMEDIATE MEDICAL CARE IF:  °· You have a  fever. °· You have a worsening or sudden vision loss. °· You have redness, swelling, or increasing pain in the eye. °· You have a thick discharge from the eye that had surgery. °MAKE SURE YOU: °· Understand these instructions. °· Will watch your condition. °· Will get help right away if you are not doing well or get worse. °  °This information is not intended to replace advice given to you by your health care provider. Make sure you discuss any questions you have with your health care provider. °  °Document Released: 03/17/2005 Document Revised: 09/18/2014 Document Reviewed: 04/21/2011 °Elsevier Interactive Patient Education ©2016 [employer] ° °

## 2016-02-21 NOTE — H&P (Signed)
I have reviewed the H&P, the patient was re-examined, and I have identified no interval changes in medical condition and plan of care since the history and physical of record  

## 2016-02-21 NOTE — Op Note (Signed)
Date of Admission: 02/21/2016  Date of Surgery: 02/21/2016   Pre-Op Dx: Cataract Right Eye  Post-Op Dx: Senile Nuclear Cataract Right  Eye,  Dx Code H25.11  Surgeon: Gemma PayorKerry Tareq Dwan, M.D.  Assistants: None  Anesthesia: Topical with MAC  Indications: Painless, progressive loss of vision with compromise of daily activities.  Surgery: Cataract Extraction with Intraocular lens Implant Right Eye  Discription: The patient had dilating drops and viscous lidocaine placed into the Right eye in the pre-op holding area. After transfer to the operating room, a time out was performed. The patient was then prepped and draped. Beginning with a 75 degree blade a paracentesis port was made at the surgeon's 2 o'clock position. The anterior chamber was then filled with 1% non-preserved lidocaine. This was followed by filling the anterior chamber with Provisc.  A 2.224mm keratome blade was used to make a clear corneal incision at the temporal limbus.  A bent cystatome needle was used to create a continuous tear capsulotomy. Hydrodissection was performed with balanced salt solution on a Fine canula. The lens nucleus was then removed using the phacoemulsification handpiece. Residual cortex was removed with the I&A handpiece. The anterior chamber and capsular bag were refilled with Provisc. A posterior chamber intraocular lens was placed into the capsular bag with it's injector. The implant was positioned with the Kuglan hook. The Provisc was then removed from the anterior chamber and capsular bag with the I&A handpiece. Stromal hydration of the main incision and paracentesis port was performed with BSS on a Fine canula. The wounds were tested for leak which was negative. The patient tolerated the procedure well. There were no operative complications. The patient was then transferred to the recovery room in stable condition.  Complications: None  Specimen: None  EBL: None  Prosthetic device: Hoya iSert 250, power 23.0 D,  SN [id number].

## 2016-02-21 NOTE — Anesthesia Postprocedure Evaluation (Signed)
Anesthesia Post Note  Patient: Tara Morgan  Procedure(s) Performed: Procedure(s) (LRB): CATARACT EXTRACTION PHACO AND INTRAOCULAR LENS PLACEMENT RIGHT EYE; CDE:  6.40 (Right)  Patient location during evaluation: Short Stay Anesthesia Type: MAC Level of consciousness: awake and alert Pain management: pain level controlled Vital Signs Assessment: post-procedure vital signs reviewed and stable Respiratory status: spontaneous breathing Cardiovascular status: blood pressure returned to baseline Postop Assessment: no signs of nausea or vomiting Anesthetic complications: no    Last Vitals:  Filed Vitals:   02/21/16 0825 02/21/16 0830  BP: 128/81 78/50  Pulse:    Temp:    Resp: 28 24    Last Pain: There were no vitals filed for this visit.               Nekita Pita

## 2016-02-22 ENCOUNTER — Encounter (HOSPITAL_COMMUNITY): Payer: Self-pay | Admitting: Ophthalmology

## 2016-05-17 DIAGNOSIS — H26492 Other secondary cataract, left eye: Secondary | ICD-10-CM | POA: Diagnosis not present

## 2016-05-17 DIAGNOSIS — H26491 Other secondary cataract, right eye: Secondary | ICD-10-CM | POA: Diagnosis not present

## 2016-05-26 ENCOUNTER — Other Ambulatory Visit (HOSPITAL_COMMUNITY): Payer: Self-pay | Admitting: Internal Medicine

## 2016-05-30 DIAGNOSIS — N6459 Other signs and symptoms in breast: Secondary | ICD-10-CM | POA: Diagnosis not present

## 2016-05-30 DIAGNOSIS — Z1389 Encounter for screening for other disorder: Secondary | ICD-10-CM | POA: Diagnosis not present

## 2016-05-30 DIAGNOSIS — Z6827 Body mass index (BMI) 27.0-27.9, adult: Secondary | ICD-10-CM | POA: Diagnosis not present

## 2016-05-30 DIAGNOSIS — J209 Acute bronchitis, unspecified: Secondary | ICD-10-CM | POA: Diagnosis not present

## 2016-05-30 DIAGNOSIS — E663 Overweight: Secondary | ICD-10-CM | POA: Diagnosis not present

## 2016-05-30 DIAGNOSIS — J069 Acute upper respiratory infection, unspecified: Secondary | ICD-10-CM | POA: Diagnosis not present

## 2016-05-30 DIAGNOSIS — N6489 Other specified disorders of breast: Secondary | ICD-10-CM | POA: Diagnosis not present

## 2017-01-26 DIAGNOSIS — Z6827 Body mass index (BMI) 27.0-27.9, adult: Secondary | ICD-10-CM | POA: Diagnosis not present

## 2017-01-26 DIAGNOSIS — F329 Major depressive disorder, single episode, unspecified: Secondary | ICD-10-CM | POA: Diagnosis not present

## 2017-01-26 DIAGNOSIS — E669 Obesity, unspecified: Secondary | ICD-10-CM | POA: Diagnosis not present

## 2017-01-26 DIAGNOSIS — Z1389 Encounter for screening for other disorder: Secondary | ICD-10-CM | POA: Diagnosis not present

## 2017-01-26 DIAGNOSIS — F419 Anxiety disorder, unspecified: Secondary | ICD-10-CM | POA: Diagnosis not present

## 2017-01-30 ENCOUNTER — Other Ambulatory Visit (HOSPITAL_COMMUNITY): Payer: Self-pay | Admitting: Registered Nurse

## 2017-01-30 DIAGNOSIS — Z1231 Encounter for screening mammogram for malignant neoplasm of breast: Secondary | ICD-10-CM

## 2017-02-12 ENCOUNTER — Ambulatory Visit (HOSPITAL_COMMUNITY)
Admission: RE | Admit: 2017-02-12 | Discharge: 2017-02-12 | Disposition: A | Discharge status: Home / Self Care | Payer: 59 | Source: Ambulatory Visit | Attending: Registered Nurse | Admitting: Registered Nurse

## 2017-02-12 DIAGNOSIS — Z1231 Encounter for screening mammogram for malignant neoplasm of breast: Secondary | ICD-10-CM | POA: Diagnosis not present

## 2017-02-12 DIAGNOSIS — R928 Other abnormal and inconclusive findings on diagnostic imaging of breast: Secondary | ICD-10-CM | POA: Diagnosis not present

## 2017-02-12 DIAGNOSIS — R920 Mammographic microcalcification found on diagnostic imaging of breast: Secondary | ICD-10-CM | POA: Diagnosis not present

## 2017-02-14 ENCOUNTER — Other Ambulatory Visit (HOSPITAL_COMMUNITY): Payer: Self-pay | Admitting: Registered Nurse

## 2017-02-14 DIAGNOSIS — R921 Mammographic calcification found on diagnostic imaging of breast: Secondary | ICD-10-CM

## 2017-02-15 DIAGNOSIS — Z1389 Encounter for screening for other disorder: Secondary | ICD-10-CM | POA: Diagnosis not present

## 2017-02-15 DIAGNOSIS — F17290 Nicotine dependence, other tobacco product, uncomplicated: Secondary | ICD-10-CM | POA: Diagnosis not present

## 2017-02-15 DIAGNOSIS — M5412 Radiculopathy, cervical region: Secondary | ICD-10-CM | POA: Diagnosis not present

## 2017-02-15 DIAGNOSIS — Z6827 Body mass index (BMI) 27.0-27.9, adult: Secondary | ICD-10-CM | POA: Diagnosis not present

## 2017-02-15 DIAGNOSIS — E669 Obesity, unspecified: Secondary | ICD-10-CM | POA: Diagnosis not present

## 2017-02-20 ENCOUNTER — Ambulatory Visit (HOSPITAL_COMMUNITY)
Admission: RE | Admit: 2017-02-20 | Discharge: 2017-02-20 | Disposition: A | Discharge status: Home / Self Care | Payer: 59 | Source: Ambulatory Visit | Attending: Registered Nurse | Admitting: Registered Nurse

## 2017-02-20 DIAGNOSIS — R921 Mammographic calcification found on diagnostic imaging of breast: Secondary | ICD-10-CM

## 2017-02-20 DIAGNOSIS — R928 Other abnormal and inconclusive findings on diagnostic imaging of breast: Secondary | ICD-10-CM | POA: Diagnosis not present

## 2017-02-23 ENCOUNTER — Other Ambulatory Visit: Payer: Self-pay | Admitting: Registered Nurse

## 2017-02-23 DIAGNOSIS — R921 Mammographic calcification found on diagnostic imaging of breast: Secondary | ICD-10-CM

## 2017-03-19 DIAGNOSIS — R030 Elevated blood-pressure reading, without diagnosis of hypertension: Secondary | ICD-10-CM | POA: Diagnosis not present

## 2017-03-19 DIAGNOSIS — Z6829 Body mass index (BMI) 29.0-29.9, adult: Secondary | ICD-10-CM | POA: Diagnosis not present

## 2017-03-19 DIAGNOSIS — M5412 Radiculopathy, cervical region: Secondary | ICD-10-CM | POA: Diagnosis not present

## 2017-03-20 ENCOUNTER — Other Ambulatory Visit (HOSPITAL_COMMUNITY): Payer: Self-pay | Admitting: Student

## 2017-03-20 DIAGNOSIS — M5412 Radiculopathy, cervical region: Secondary | ICD-10-CM

## 2017-03-29 ENCOUNTER — Ambulatory Visit (HOSPITAL_COMMUNITY)
Admission: RE | Admit: 2017-03-29 | Discharge: 2017-03-29 | Disposition: A | Discharge status: Home / Self Care | Payer: 59 | Source: Ambulatory Visit | Attending: Student | Admitting: Student

## 2017-03-29 DIAGNOSIS — E041 Nontoxic single thyroid nodule: Secondary | ICD-10-CM | POA: Diagnosis not present

## 2017-03-29 DIAGNOSIS — M5031 Other cervical disc degeneration,  high cervical region: Secondary | ICD-10-CM | POA: Insufficient documentation

## 2017-03-29 DIAGNOSIS — M542 Cervicalgia: Secondary | ICD-10-CM | POA: Diagnosis not present

## 2017-03-29 DIAGNOSIS — M4802 Spinal stenosis, cervical region: Secondary | ICD-10-CM | POA: Insufficient documentation

## 2017-03-29 DIAGNOSIS — M5412 Radiculopathy, cervical region: Secondary | ICD-10-CM | POA: Diagnosis not present

## 2017-04-02 ENCOUNTER — Ambulatory Visit
Admission: RE | Admit: 2017-04-02 | Discharge: 2017-04-02 | Disposition: A | Discharge status: Home / Self Care | Payer: 59 | Source: Ambulatory Visit | Attending: Registered Nurse | Admitting: Registered Nurse

## 2017-04-02 DIAGNOSIS — N6011 Diffuse cystic mastopathy of right breast: Secondary | ICD-10-CM | POA: Diagnosis not present

## 2017-04-02 DIAGNOSIS — R921 Mammographic calcification found on diagnostic imaging of breast: Secondary | ICD-10-CM

## 2017-04-02 HISTORY — PX: BREAST BIOPSY: SHX20

## 2017-04-16 DIAGNOSIS — Z6829 Body mass index (BMI) 29.0-29.9, adult: Secondary | ICD-10-CM | POA: Diagnosis not present

## 2017-04-16 DIAGNOSIS — R030 Elevated blood-pressure reading, without diagnosis of hypertension: Secondary | ICD-10-CM | POA: Diagnosis not present

## 2017-04-16 DIAGNOSIS — M5412 Radiculopathy, cervical region: Secondary | ICD-10-CM | POA: Diagnosis not present

## 2017-05-10 DIAGNOSIS — Z6829 Body mass index (BMI) 29.0-29.9, adult: Secondary | ICD-10-CM | POA: Diagnosis not present

## 2017-05-10 DIAGNOSIS — M9981 Other biomechanical lesions of cervical region: Secondary | ICD-10-CM | POA: Diagnosis not present

## 2017-05-10 DIAGNOSIS — R030 Elevated blood-pressure reading, without diagnosis of hypertension: Secondary | ICD-10-CM | POA: Diagnosis not present

## 2017-05-10 DIAGNOSIS — M5412 Radiculopathy, cervical region: Secondary | ICD-10-CM | POA: Diagnosis not present

## 2017-05-15 DIAGNOSIS — R030 Elevated blood-pressure reading, without diagnosis of hypertension: Secondary | ICD-10-CM | POA: Diagnosis not present

## 2017-05-15 DIAGNOSIS — M9981 Other biomechanical lesions of cervical region: Secondary | ICD-10-CM | POA: Diagnosis not present

## 2017-05-15 DIAGNOSIS — M5412 Radiculopathy, cervical region: Secondary | ICD-10-CM | POA: Diagnosis not present

## 2017-05-15 DIAGNOSIS — Z6829 Body mass index (BMI) 29.0-29.9, adult: Secondary | ICD-10-CM | POA: Diagnosis not present

## 2019-02-18 ENCOUNTER — Ambulatory Visit (HOSPITAL_COMMUNITY)
Admission: RE | Admit: 2019-02-18 | Discharge: 2019-02-18 | Disposition: A | Discharge status: Home / Self Care | Payer: BC Managed Care – PPO | Source: Ambulatory Visit | Attending: Physician Assistant | Admitting: Physician Assistant

## 2019-02-18 ENCOUNTER — Other Ambulatory Visit (HOSPITAL_COMMUNITY): Payer: Self-pay | Admitting: Physician Assistant

## 2019-02-18 ENCOUNTER — Other Ambulatory Visit: Payer: Self-pay

## 2019-02-18 DIAGNOSIS — M25552 Pain in left hip: Secondary | ICD-10-CM | POA: Diagnosis not present

## 2019-12-11 ENCOUNTER — Ambulatory Visit: Payer: Self-pay | Admitting: Orthopedic Surgery

## 2020-09-11 HISTORY — PX: JOINT REPLACEMENT: SHX530

## 2020-11-15 IMAGING — DX DG HIP (WITH OR WITHOUT PELVIS) 2-3V LEFT
3 series · 3 of 3 positions shown · non-contrast
Comparison: None.

CLINICAL DATA: Left hip pain for 2 weeks, no known injury, initial
encounter

EXAM:
DG HIP (WITH OR WITHOUT PELVIS) 3V LEFT

[pelvis ap]
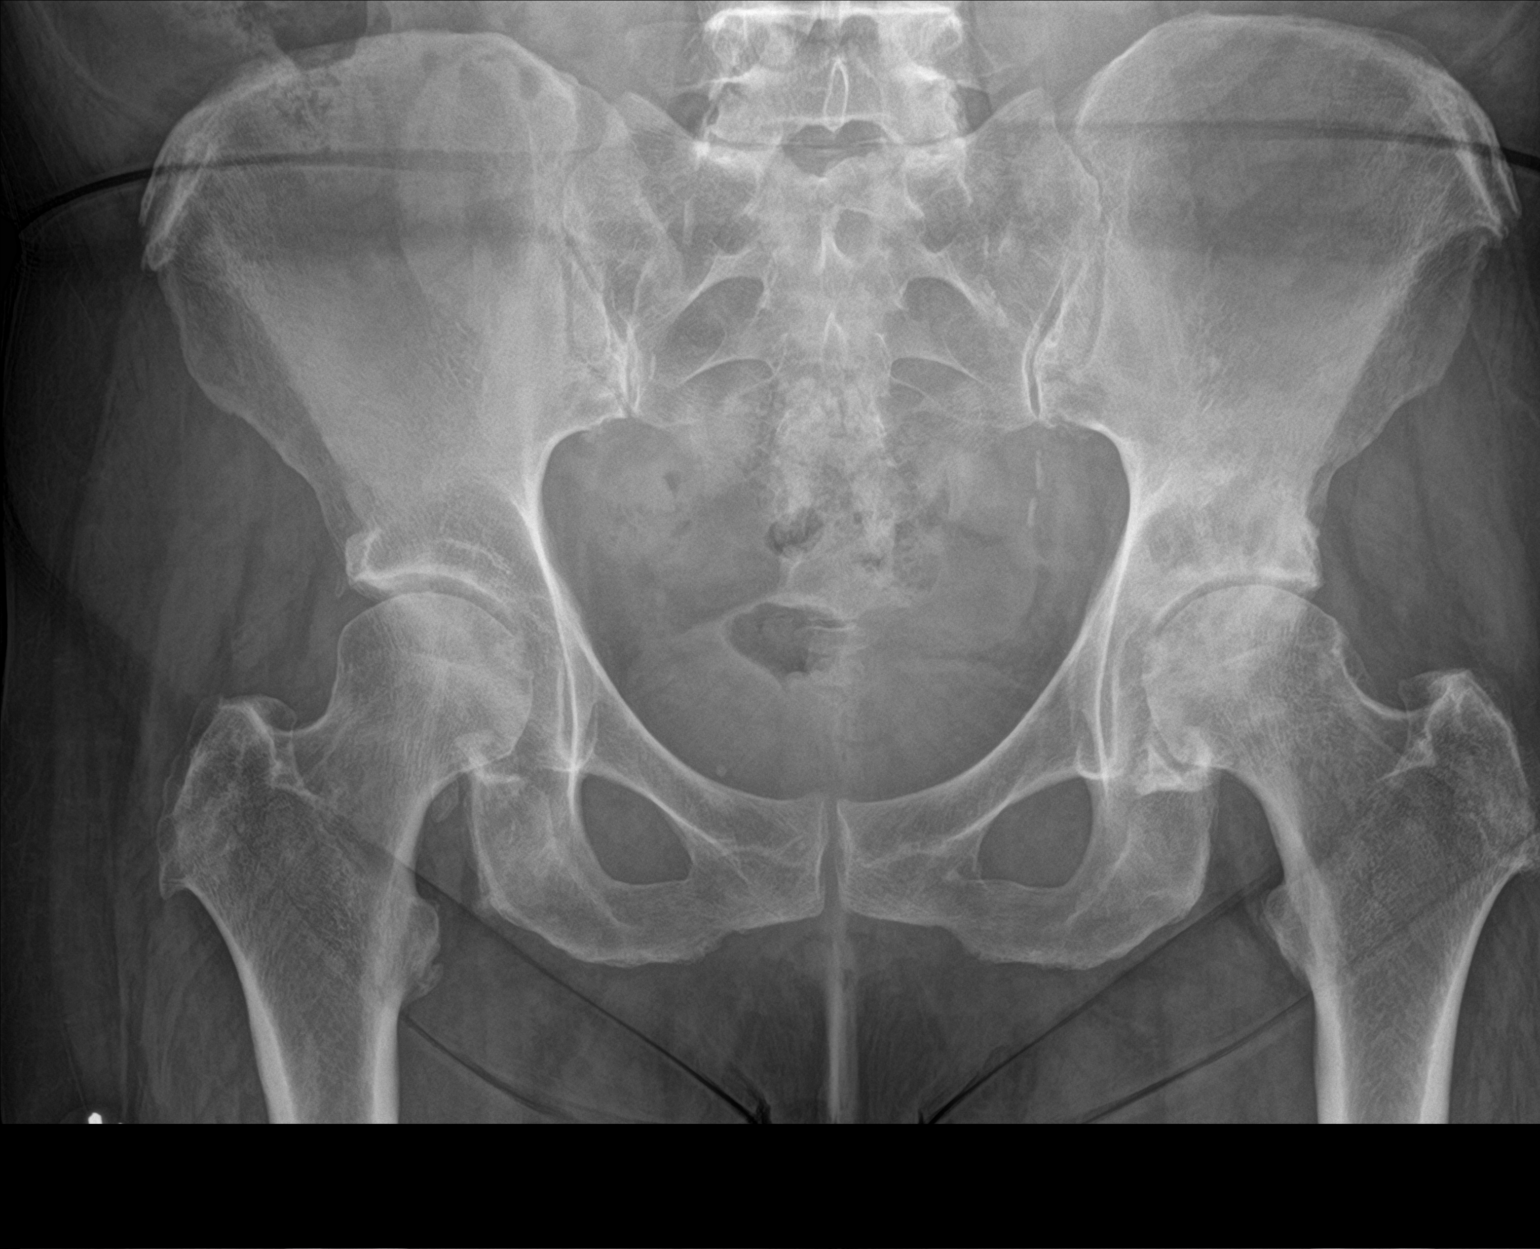

[hip ap]
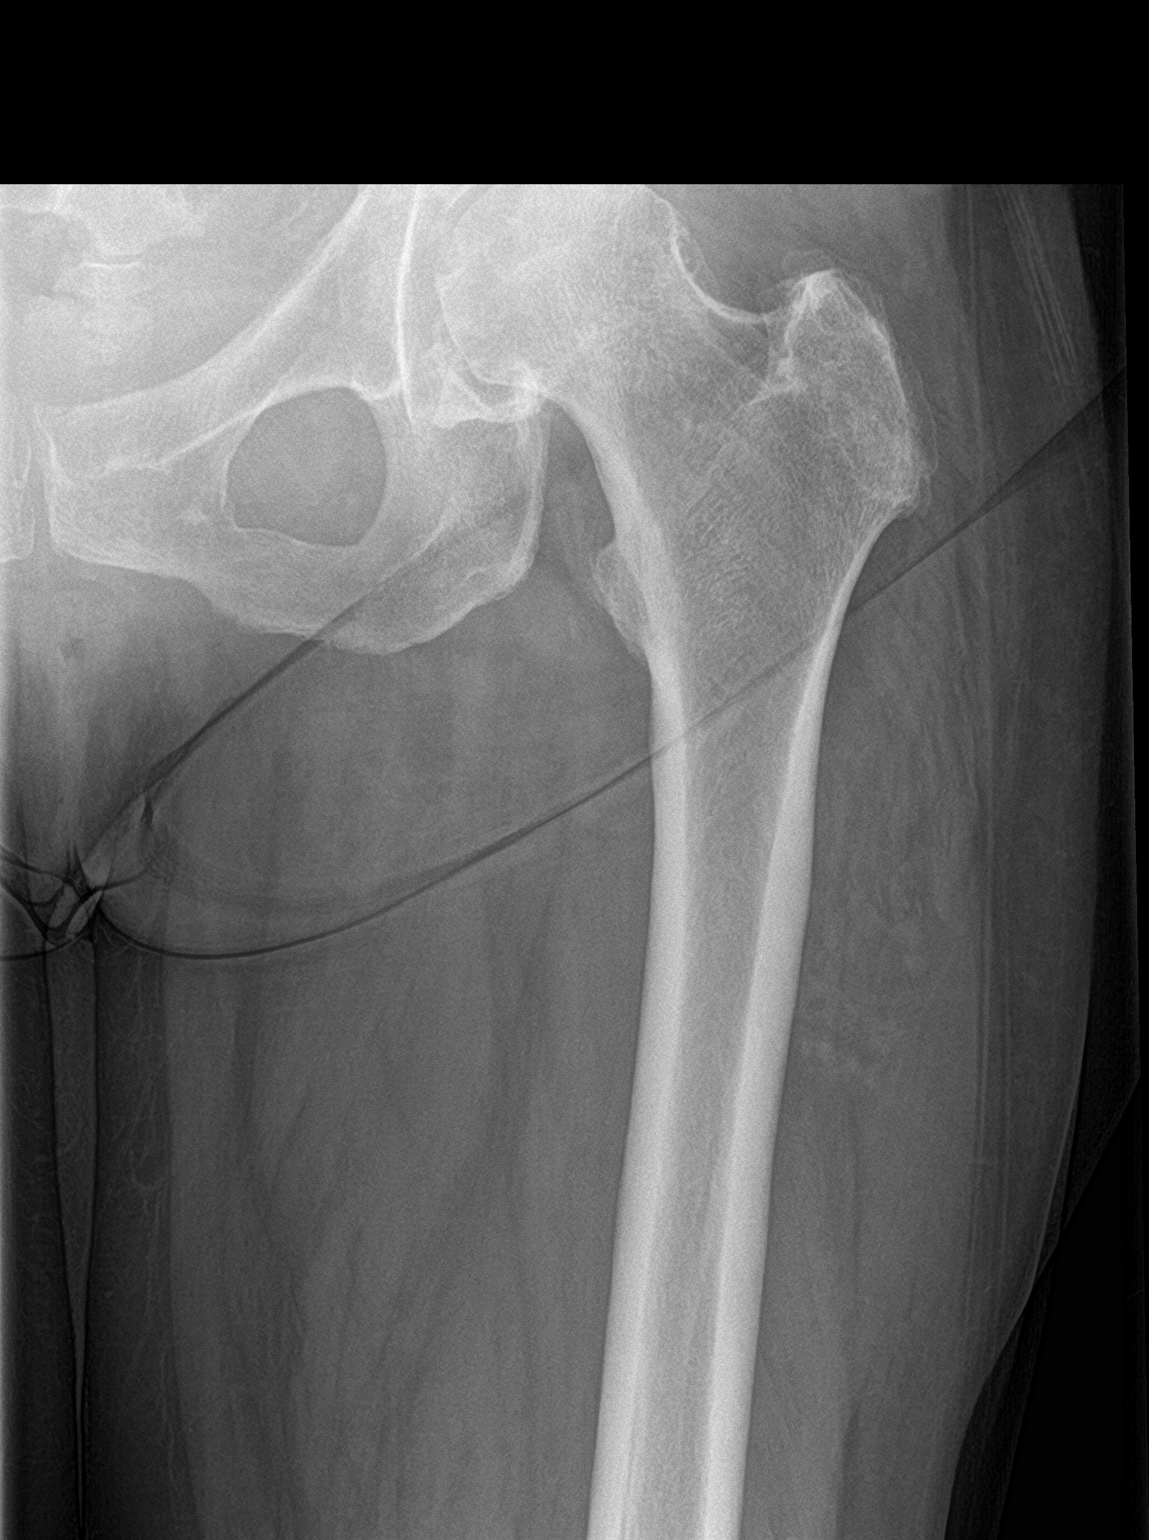

[hip lat]
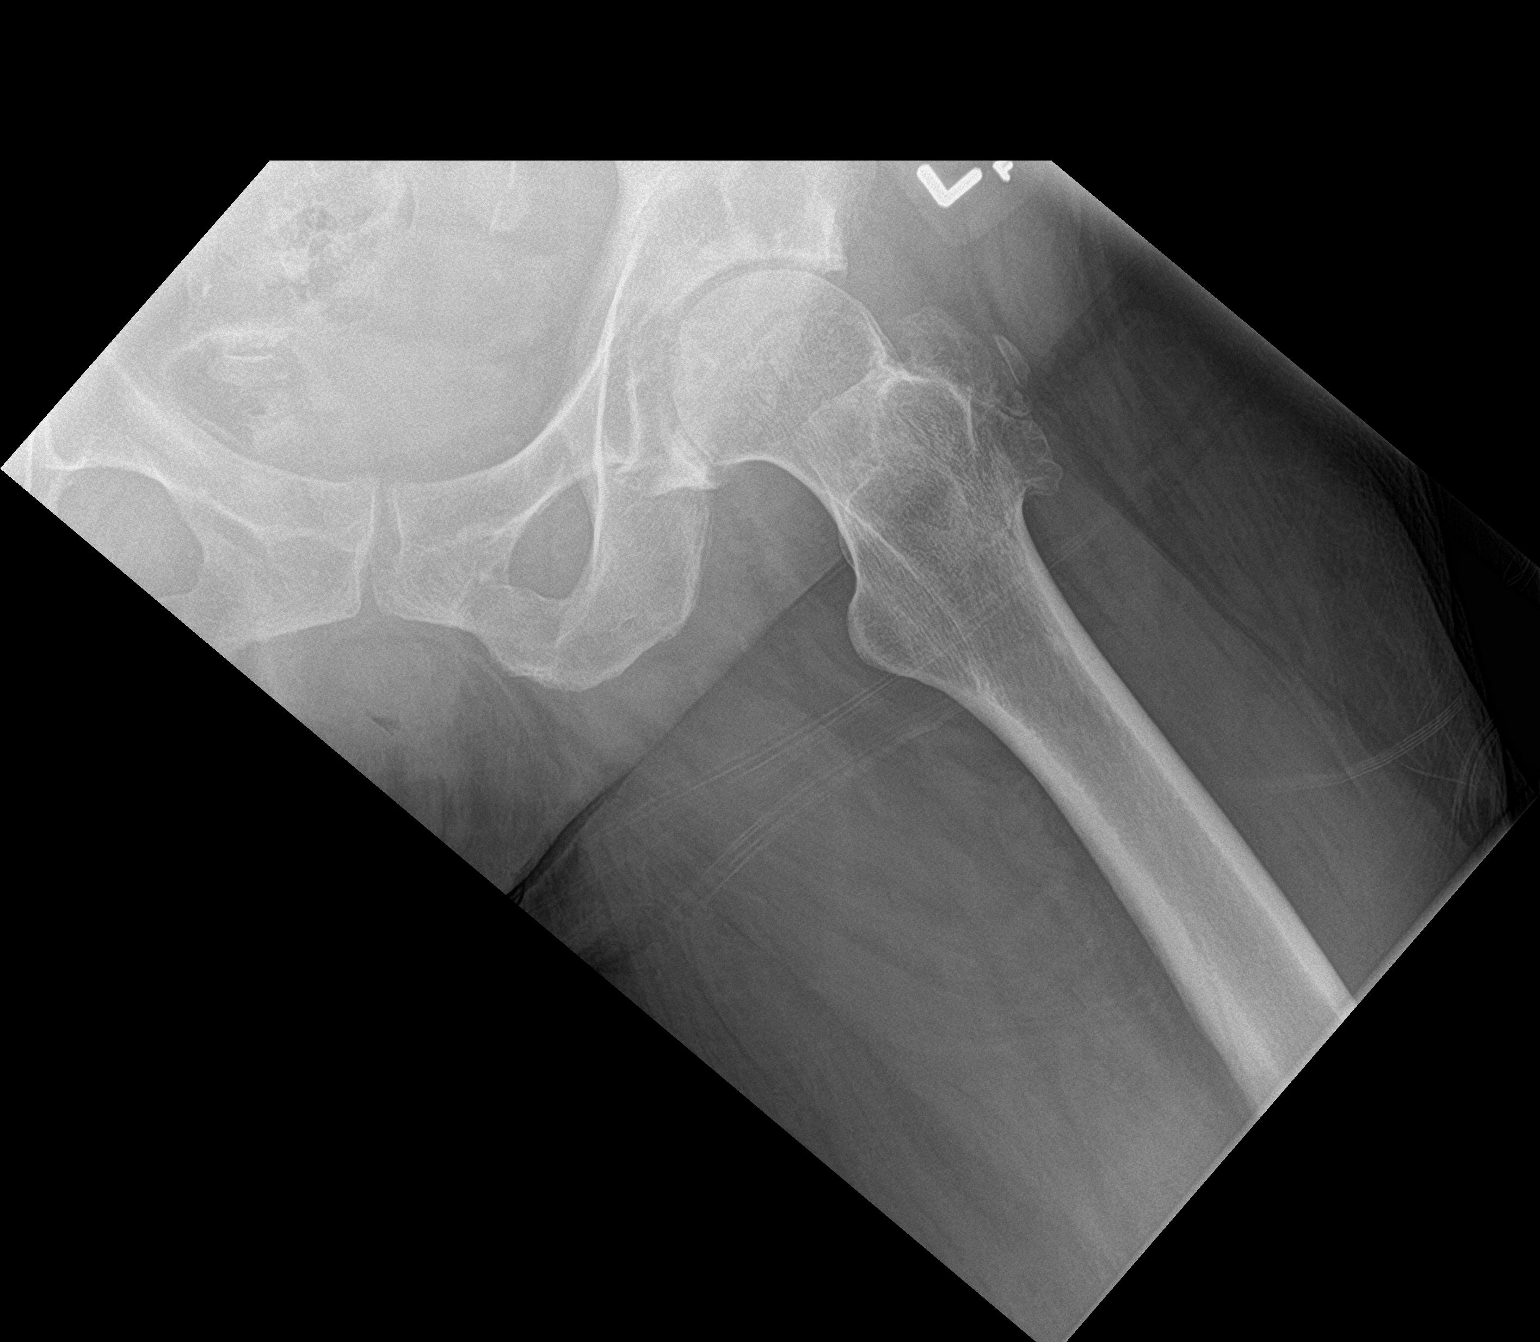

[3 of 3 positions shown; findings below may reference images not displayed]

FINDINGS: Pelvic ring is intact. Degenerative changes are noted in the left
hip joint with subchondral sclerosis and cyst formation in the
acetabulum. No acute fracture or dislocation is seen.
IMPRESSION: Degenerative change without acute abnormality.

## 2021-05-24 ENCOUNTER — Ambulatory Visit
Admission: EM | Admit: 2021-05-24 | Discharge: 2021-05-24 | Disposition: A | Discharge status: Home / Self Care | Payer: BC Managed Care – PPO | Attending: Internal Medicine | Admitting: Internal Medicine

## 2021-05-24 ENCOUNTER — Encounter: Payer: Self-pay | Admitting: Emergency Medicine

## 2021-05-24 ENCOUNTER — Other Ambulatory Visit: Payer: Self-pay

## 2021-05-24 DIAGNOSIS — H6983 Other specified disorders of Eustachian tube, bilateral: Secondary | ICD-10-CM | POA: Diagnosis not present

## 2021-05-24 MED ORDER — FLUTICASONE PROPIONATE 50 MCG/ACT NA SUSP: 1.0000 | Freq: Every day | NASAL | 0 refills | Status: DC

## 2021-05-24 NOTE — Discharge Instructions (Addendum)
Saline nasal spray Warm salt water gargle will help with symptoms described If you develop worsening ear pain, hearing loss-please return to urgent care to be reevaluated

## 2021-05-24 NOTE — ED Provider Notes (Signed)
[facility]    CSN: [id number] Arrival date & time: 05/24/21  1552      History   Chief Complaint No chief complaint on file.   HPI Tara Morgan is a 60 y.o. female comes to the urgent care with bilateral ear pain and feeling stopped up.  Patient's symptoms started a few days ago and has been persistent.  She has some nasal congestion with postnasal drainage.  Patient has some cough with mild sputum production.  She denies any fever or chills.  No sick contacts.  Patient used hydroperoxide to help with the ear symptoms 3 days ago.  No change in the patient's condition.  She denies any ringing in the ears or any hearing difficulties. HPI  History reviewed. No pertinent past medical history.  There are no problems to display for this patient.   Past Surgical History:  Procedure Laterality Date   CATARACT EXTRACTION W/PHACO Left 01/03/2016   Procedure: CATARACT EXTRACTION PHACO AND INTRAOCULAR LENS PLACEMENT (IOC);  Surgeon: Gemma Payor, MD;  Location: [facility];  Service: Ophthalmology;  Laterality: Left;  CDE:5.86   CATARACT EXTRACTION W/PHACO Right 02/21/2016   Procedure: CATARACT EXTRACTION PHACO AND INTRAOCULAR LENS PLACEMENT RIGHT EYE; CDE:  6.40;  Surgeon: Gemma Payor, MD;  Location: [facility];  Service: Ophthalmology;  Laterality: Right;   CHOLECYSTECTOMY      OB History   No obstetric history on file.      Home Medications    Prior to Admission medications   Medication Sig Start Date End Date Taking? Authorizing Provider  cholecalciferol (VITAMIN D3) 25 MCG (1000 UNIT) tablet Take 1,000 Units by mouth daily.   Yes [provider]  fluticasone (FLONASE) 50 MCG/ACT nasal spray Place 1 spray into both nostrils daily. 05/24/21  Yes Merdith Adan, Britta Mccreedy, MD    Family History Family History  Problem Relation Age of Onset   Dementia Mother    Heart failure Father     Social History Social History   Tobacco Use   Smoking status: Every Day    Packs/day:  1.00    Years: 45.00    Pack years: 45.00    Types: Cigarettes   Smokeless tobacco: Never  Vaping Use   Vaping Use: Never used  Substance Use Topics   Alcohol use: Yes    Comment: 2 times week   Drug use: No     Allergies   Patient has no known allergies.   Review of Systems Review of Systems  HENT:  Positive for ear pain. Negative for ear discharge, facial swelling and hearing loss.   Respiratory: Negative.  Negative for chest tightness, shortness of breath and stridor.   Neurological: Negative.     Physical Exam Triage Vital Signs ED Triage Vitals  Enc Vitals Group     BP 05/24/21 1724 (!) 200/72     Pulse Rate 05/24/21 1724 (!) 55     Resp 05/24/21 1724 16     Temp 05/24/21 1724 98 F (36.7 C)     Temp Source 05/24/21 1724 Oral     SpO2 05/24/21 1724 98 %     Weight --      Height --      Head Circumference --      Peak Flow --      Pain Score 05/24/21 1726 7     Pain Loc --      Pain Edu? --      Excl. in GC? --  No data found.  Updated Vital Signs BP (!) 170/73 (BP Location: Right Arm)   Pulse (!) 55   Temp 98 F (36.7 C) (Oral)   Resp 16   SpO2 98%   Visual Acuity Right Eye Distance:   Left Eye Distance:   Bilateral Distance:    Right Eye Near:   Left Eye Near:    Bilateral Near:     Physical Exam Vitals and nursing note reviewed.  Constitutional:      General: She is not in acute distress.    Appearance: She is not ill-appearing.  HENT:     Right Ear: Tympanic membrane, ear canal and external ear normal.     Left Ear: Tympanic membrane, ear canal and external ear normal.     Mouth/Throat:     Mouth: Mucous membranes are moist.  Cardiovascular:     Rate and Rhythm: Normal rate and regular rhythm.     Pulses: Normal pulses.     Heart sounds: Normal heart sounds.  Neurological:     Mental Status: She is alert.     UC Treatments / Results  Labs (all labs ordered are listed, but only abnormal results are displayed) Labs  Reviewed - No data to display  EKG   Radiology No results found.  Procedures Procedures (including critical care time)  Medications Ordered in UC Medications - No data to display  Initial Impression / Assessment and Plan / UC Course  I have reviewed the triage vital signs and the nursing notes.  Pertinent labs & imaging results that were available during my care of the patient were reviewed by me and considered in my medical decision making (see chart for details).     1.  Eustachian tube dysfunction: Saline nasal spray Fluticasone nasal spray Warm salt water gargle If symptoms worsen or persist please return to urgent care to be reevaluated. Final Clinical Impressions(s) / UC Diagnoses   Final diagnoses:  Eustachian tube dysfunction, bilateral     Discharge Instructions      Saline nasal spray Warm salt water gargle will help with symptoms described If you develop worsening ear pain, hearing loss-please return to urgent care to be reevaluated    ED Prescriptions     Medication Sig Dispense Auth. Provider   fluticasone (FLONASE) 50 MCG/ACT nasal spray Place 1 spray into both nostrils daily. 16 g Mima Cranmore, Britta Mccreedy, MD      PDMP not reviewed this encounter.   Merrilee Jansky, MD 05/24/21 [id number]

## 2021-05-24 NOTE — ED Triage Notes (Signed)
Ears have felt stopped up and hurt since Thursday.  Nasal drainage.

## 2022-04-27 DIAGNOSIS — L308 Other specified dermatitis: Secondary | ICD-10-CM | POA: Diagnosis not present

## 2022-04-27 DIAGNOSIS — C44629 Squamous cell carcinoma of skin of left upper limb, including shoulder: Secondary | ICD-10-CM | POA: Diagnosis not present

## 2022-06-08 DIAGNOSIS — Z08 Encounter for follow-up examination after completed treatment for malignant neoplasm: Secondary | ICD-10-CM | POA: Diagnosis not present

## 2022-06-08 DIAGNOSIS — Z85828 Personal history of other malignant neoplasm of skin: Secondary | ICD-10-CM | POA: Diagnosis not present

## 2022-06-08 DIAGNOSIS — C44629 Squamous cell carcinoma of skin of left upper limb, including shoulder: Secondary | ICD-10-CM | POA: Diagnosis not present

## 2023-07-18 ENCOUNTER — Other Ambulatory Visit (HOSPITAL_COMMUNITY): Payer: Self-pay | Admitting: Family Medicine

## 2023-07-18 DIAGNOSIS — Z1231 Encounter for screening mammogram for malignant neoplasm of breast: Secondary | ICD-10-CM

## 2023-07-18 DIAGNOSIS — F17290 Nicotine dependence, other tobacco product, uncomplicated: Secondary | ICD-10-CM | POA: Diagnosis not present

## 2023-07-18 DIAGNOSIS — Z1389 Encounter for screening for other disorder: Secondary | ICD-10-CM | POA: Diagnosis not present

## 2023-07-18 DIAGNOSIS — E785 Hyperlipidemia, unspecified: Secondary | ICD-10-CM | POA: Diagnosis not present

## 2023-07-24 ENCOUNTER — Encounter (INDEPENDENT_AMBULATORY_CARE_PROVIDER_SITE_OTHER): Payer: Self-pay | Admitting: *Deleted

## 2023-07-30 ENCOUNTER — Encounter (HOSPITAL_COMMUNITY): Payer: Self-pay

## 2023-07-30 ENCOUNTER — Ambulatory Visit (HOSPITAL_COMMUNITY)
Admission: RE | Admit: 2023-07-30 | Discharge: 2023-07-30 | Disposition: A | Discharge status: Home / Self Care | Payer: 59 | Source: Ambulatory Visit | Attending: Family Medicine | Admitting: Family Medicine

## 2023-07-30 DIAGNOSIS — Z1231 Encounter for screening mammogram for malignant neoplasm of breast: Secondary | ICD-10-CM | POA: Diagnosis not present

## 2023-08-02 ENCOUNTER — Other Ambulatory Visit (HOSPITAL_COMMUNITY): Payer: Self-pay | Admitting: Family Medicine

## 2023-08-02 DIAGNOSIS — R928 Other abnormal and inconclusive findings on diagnostic imaging of breast: Secondary | ICD-10-CM

## 2023-08-30 ENCOUNTER — Ambulatory Visit (HOSPITAL_COMMUNITY)
Admission: RE | Admit: 2023-08-30 | Discharge: 2023-08-30 | Disposition: A | Discharge status: Home / Self Care | Payer: 59 | Source: Ambulatory Visit | Attending: Family Medicine | Admitting: Family Medicine

## 2023-08-30 ENCOUNTER — Encounter (HOSPITAL_COMMUNITY): Payer: Self-pay

## 2023-08-30 DIAGNOSIS — R928 Other abnormal and inconclusive findings on diagnostic imaging of breast: Secondary | ICD-10-CM | POA: Insufficient documentation

## 2023-08-30 DIAGNOSIS — N6310 Unspecified lump in the right breast, unspecified quadrant: Secondary | ICD-10-CM | POA: Diagnosis not present

## 2023-08-30 DIAGNOSIS — R92321 Mammographic fibroglandular density, right breast: Secondary | ICD-10-CM | POA: Diagnosis not present

## 2023-09-17 DIAGNOSIS — T148XXA Other injury of unspecified body region, initial encounter: Secondary | ICD-10-CM | POA: Diagnosis not present

## 2023-09-25 ENCOUNTER — Encounter: Payer: Self-pay | Admitting: Orthopedic Surgery

## 2023-09-25 ENCOUNTER — Ambulatory Visit: Payer: 59 | Admitting: Orthopedic Surgery

## 2023-09-25 ENCOUNTER — Other Ambulatory Visit (INDEPENDENT_AMBULATORY_CARE_PROVIDER_SITE_OTHER): Payer: Self-pay

## 2023-09-25 ENCOUNTER — Other Ambulatory Visit: Payer: Self-pay

## 2023-09-25 VITALS — BP 169/68 | HR 61 | Ht 67.0 in | Wt 167.8 lb

## 2023-09-25 DIAGNOSIS — M79672 Pain in left foot: Secondary | ICD-10-CM | POA: Diagnosis not present

## 2023-09-25 DIAGNOSIS — M79671 Pain in right foot: Secondary | ICD-10-CM

## 2023-09-25 DIAGNOSIS — M722 Plantar fascial fibromatosis: Secondary | ICD-10-CM | POA: Diagnosis not present

## 2023-09-25 NOTE — Patient Instructions (Signed)
 Instructions  1.  You have sustained an avulsion fracture in your foot.   2.  In many ways, this can be treated similar to an ankle sprain.  3.  I encourage you to stay on your feet and gradually remove your walking boot.   4.  Below are some exercises that you can complete on your own to improve your symptoms.  5.  As an alternative, you can search for ankle sprain exercises online, and can see some demonstrations on YouTube   Ankle Exercises Ask your health care provider which exercises are safe for you. Do exercises exactly as told by your health care provider and adjust them as directed. It is normal to feel mild stretching, pulling, tightness, or mild discomfort as you do these exercises. Stop right away if you feel sudden pain or your pain gets worse. Do not begin these exercises until told by your health care provider.  Stretching and range-of-motion exercises These exercises warm up your muscles and joints and improve the movement and flexibility of your ankle. These exercises may also help to relieve pain.  Dorsiflexion/plantar flexion  Sit with your B knee straight or bent. Do not rest your foot on anything. Flex your left ankle to tilt the top of your foot toward your shin. This is called dorsiflexion. Hold this position for 5 seconds. Point your toes downward to tilt the top of your foot away from your shin. This is called plantar flexion. Hold this position for 5 seconds. Repeat 10 times. Complete this exercise 2-3 times a day.  As tolerated  Ankle alphabet  Sit with your B foot supported at your lower leg. Do not rest your foot on anything. Make sure your foot has room to move freely. Think of your B foot as a paintbrush: Move your foot to trace each letter of the alphabet in the air. Keep your hip and knee still while you trace the letters. Trace every letter from A to Z. Make the letters as large as you can without causing or increasing any discomfort.  Repeat 2-3  times. Complete this exercise 2-3 times a day.   Strengthening exercises These exercises build strength and endurance in your ankle. Endurance is the ability to use your muscles for a long time, even after they get tired. Dorsiflexors These are muscles that lift your foot up. Secure a rubber exercise band or tube to an object, such as a table leg, that will stay still when the band is pulled. Secure the other end around your B foot. Sit on the floor, facing the object with your B leg extended. The band or tube should be slightly tense when your foot is relaxed. Slowly flex your B ankle and toes to bring your foot toward your shin. Hold this position for 5 seconds. Slowly return your foot to the starting position, controlling the band as you do that. Repeat 10 times. Complete this exercise 2-3 times a day.  Plantar flexors These are muscles that push your foot down. Sit on the floor with your B leg extended. Loop a rubber exercise band or tube around the ball of your B foot. The ball of your foot is on the walking surface, right under your toes. The band or tube should be slightly tense when your foot is relaxed. Slowly point your toes downward, pushing them away from you. Hold this position for 5 seconds. Slowly release the tension in the band or tube, controlling smoothly until your foot is back in  the starting position. Repeat 10 times. Complete this exercise 2-3 times a day.  Towel curls  Sit in a chair on a non-carpeted surface, and put your feet on the floor. Place a towel in front of your feet. Keeping your heel on the floor, put your B foot on the towel. Pull the towel toward you by grabbing the towel with your toes and curling them under. Keep your heel on the floor. Let your toes relax. Grab the towel again. Keep pulling the towel until it is completely underneath your foot. Repeat 10 times. Complete this exercise 2-3 times a day.  Standing plantar flexion This is an  exercise in which you use your toes to lift your body's weight while standing. Stand with your feet shoulder-width apart. Keep your weight spread evenly over the width of your feet while you rise up on your toes. Use a wall or table to steady yourself if needed, but try not to use it for support. If this exercise is too easy, try these options: Shift your weight toward your B leg until you feel challenged. If told by your health care provider, lift your uninjured leg off the floor. Hold this position for 5 seconds. Repeat 10 times. Complete this exercise 2-3 times a day.  Tandem walking Stand with one foot directly in front of the other. Slowly raise your back foot up, lifting your heel before your toes, and place it directly in front of your other foot. Continue to walk in this heel-to-toe way. Have a countertop or wall nearby to use if needed to keep your balance, but try not to hold onto anything for support.  Repeat 10 times. Complete this exercise 2-3 times a day.   Document Revised: 05/25/2018 Document Reviewed: 05/27/2018 Elsevier Patient Education  2020 [employer].

## 2023-09-25 NOTE — Progress Notes (Signed)
 New Patient Visit  Assessment: Tara Morgan is a 63 y.o. female with the following: 1. Foot pain, bilateral 2. Plantar fascial fibromatosis  Plan: Tara Morgan has pain in both feet.  Right is worse than left.  She has some bruising to the right heel, without obvious explanation.  No injury.  No impact.  On physical exam, the Achilles tendon is in continuity, as is the plantar fascia.  No pain over the anterior lateral ankle.  Based on the appearance, there has been some trauma to the foot, but this could have been caused by hitting the foot.  More concerning to her are the growths on the plantar feet bilaterally.  These are most consistent with plantar fibromas.  This condition was discussed.  I provided reassurance as they are benign lesions.  She states understanding.  Provided some simple exercises, as well as an explanation of her condition.  Nothing further needed at this time.  We may have to consider referral to podiatry for further treatment, if the patient remains concerned.  Follow-up: No follow-ups on file.  Subjective:  Chief Complaint  Patient presents with   Foot Pain    Bilat foot pain R > L for 1.5 mos. Pt sates knots pop[ing up on the bottom of feet with bruising to the R heel as well.     History of Present Illness: Tara Morgan is a 63 y.o. female who has been referred by Norleen General, MD for evaluation of bilateral foot pain.  She has pain in both feet.  Right is worse than left.  She notes bruising in the right heel, which extended over the lateral border of the foot to the small toe.  No specific injury.  She did not twist her ankle.  She is not certain what has caused the bleeding.  In addition, she has noticed some growths on the plantar aspect of her feet bilaterally.  There is some tenderness to palpation.  No overlying skin change.  She first noticed these growths within the last couple of months.  He has tried multiple different pairs of shoes, without resolution  of her symptoms.   Review of Systems: No fevers or chills No numbness or tingling No chest pain No shortness of breath No bowel or bladder dysfunction No GI distress No headaches   Medical History:  No past medical history on file.  Past Surgical History:  Procedure Laterality Date   BREAST BIOPSY Right 04/02/2017   FIBROADENFIBROCYSTIC CHANGES AND ADENOSIS WITH CALCIFICATIONS   CATARACT EXTRACTION W/PHACO Left 01/03/2016   Procedure: CATARACT EXTRACTION PHACO AND INTRAOCULAR LENS PLACEMENT (IOC);  Surgeon: Cherene Mania, MD;  Location: [facility];  Service: Ophthalmology;  Laterality: Left;  CDE:5.86   CATARACT EXTRACTION W/PHACO Right 02/21/2016   Procedure: CATARACT EXTRACTION PHACO AND INTRAOCULAR LENS PLACEMENT RIGHT EYE; CDE:  6.40;  Surgeon: Cherene Mania, MD;  Location: [facility];  Service: Ophthalmology;  Laterality: Right;   CHOLECYSTECTOMY      Family History  Problem Relation Age of Onset   Dementia Mother    Heart failure Father    Social History   Tobacco Use   Smoking status: Every Day    Current packs/day: 1.00    Average packs/day: 1 pack/day for 45.0 years (45.0 ttl pk-yrs)    Types: Cigarettes   Smokeless tobacco: Never  Vaping Use   Vaping status: Never Used  Substance Use Topics   Alcohol use: Yes    Comment: 2 times week  Drug use: No    No Known Allergies  Current Meds  Medication Sig   cholecalciferol (VITAMIN D3) 25 MCG (1000 UNIT) tablet Take 1,000 Units by mouth daily.   olmesartan (BENICAR) 5 MG tablet Take by mouth daily.    Objective: BP (!) 169/68   Pulse 61   Ht 5' 7 (1.702 m)   Wt 167 lb 12.8 oz (76.1 kg)   BMI 26.28 kg/m   Physical Exam:  General: Alert and oriented. and No acute distress. Gait: Normal gait.  Evaluation of the right foot demonstrates ecchymosis over the plantar lateral aspect of the calcaneus, with some mild ecchymosis over the lateral border of the foot to the small toe.  Minimal tenderness to palpation.   Within the plantar aspect of bilateral feet, there are benign appearing growths.  These are mildly tender to palpation.  No skin changes.  No redness.  Toes warm and well-perfused.  IMAGING: I personally ordered and reviewed the following images  X-rays of bilateral feet were obtained in clinic today.  These are nonweightbearing.  No acute injuries noted.  No dislocation.  Lateral view of the calcaneus demonstrates bone spurs at the insertion of the Achilles, as well as the plantar fascia.  No bony lesions.  Mild soft tissue swelling.  Impression: Negative bilateral feet x-rays   New Medications:  No orders of the defined types were placed in this encounter.     Tara DELENA Horde, MD  09/25/2023 2:31 PM

## 2023-11-05 DIAGNOSIS — M7730 Calcaneal spur, unspecified foot: Secondary | ICD-10-CM | POA: Diagnosis not present

## 2023-11-05 DIAGNOSIS — M722 Plantar fascial fibromatosis: Secondary | ICD-10-CM | POA: Diagnosis not present

## 2023-11-05 DIAGNOSIS — M79671 Pain in right foot: Secondary | ICD-10-CM | POA: Diagnosis not present

## 2023-11-05 DIAGNOSIS — M79672 Pain in left foot: Secondary | ICD-10-CM | POA: Diagnosis not present

## 2023-11-12 DIAGNOSIS — H81399 Other peripheral vertigo, unspecified ear: Secondary | ICD-10-CM | POA: Diagnosis not present

## 2023-11-12 DIAGNOSIS — J440 Chronic obstructive pulmonary disease with acute lower respiratory infection: Secondary | ICD-10-CM | POA: Diagnosis not present

## 2023-11-12 DIAGNOSIS — I10 Essential (primary) hypertension: Secondary | ICD-10-CM | POA: Diagnosis not present

## 2023-11-14 ENCOUNTER — Other Ambulatory Visit (HOSPITAL_COMMUNITY): Payer: Self-pay | Admitting: Internal Medicine

## 2023-11-14 DIAGNOSIS — Z87891 Personal history of nicotine dependence: Secondary | ICD-10-CM

## 2023-11-20 ENCOUNTER — Encounter (INDEPENDENT_AMBULATORY_CARE_PROVIDER_SITE_OTHER): Payer: Self-pay | Admitting: *Deleted

## 2023-11-28 ENCOUNTER — Ambulatory Visit (HOSPITAL_COMMUNITY)
Admission: RE | Admit: 2023-11-28 | Discharge: 2023-11-28 | Disposition: A | Discharge status: Home / Self Care | Source: Ambulatory Visit | Attending: Internal Medicine | Admitting: Internal Medicine

## 2023-11-28 DIAGNOSIS — Z87891 Personal history of nicotine dependence: Secondary | ICD-10-CM | POA: Insufficient documentation

## 2023-11-28 DIAGNOSIS — F17210 Nicotine dependence, cigarettes, uncomplicated: Secondary | ICD-10-CM | POA: Diagnosis not present

## 2023-12-03 DIAGNOSIS — M79671 Pain in right foot: Secondary | ICD-10-CM | POA: Diagnosis not present

## 2023-12-03 DIAGNOSIS — M7730 Calcaneal spur, unspecified foot: Secondary | ICD-10-CM | POA: Diagnosis not present

## 2023-12-03 DIAGNOSIS — M79672 Pain in left foot: Secondary | ICD-10-CM | POA: Diagnosis not present

## 2023-12-03 DIAGNOSIS — M722 Plantar fascial fibromatosis: Secondary | ICD-10-CM | POA: Diagnosis not present

## 2023-12-13 ENCOUNTER — Telehealth (INDEPENDENT_AMBULATORY_CARE_PROVIDER_SITE_OTHER): Payer: Self-pay | Admitting: Gastroenterology

## 2023-12-13 NOTE — Telephone Encounter (Signed)
 Who is your primary care physician: Dr.Hasanaj  Reasons for the colonoscopy: screening  Have you had a colonoscopy before?  no  Do you have family history of colon cancer? no  Previous colonoscopy with polyps removed? no  Do you have a history colorectal cancer?   no  Are you diabetic? If yes, Type 1 or Type 2?    no  Do you have a prosthetic or mechanical heart valve? no  Do you have a pacemaker/defibrillator?   no  Have you had endocarditis/atrial fibrillation? no  Have you had joint replacement within the last 12 months?  no  Do you tend to be constipated or have to use laxatives? no  Do you have any history of drugs or alchohol?  no  Do you use supplemental oxygen?  no  Have you had a stroke or heart attack within the last 6 months? no  Do you take weight loss medication?  no  For female patients: have you had a hysterectomy?  no                                     are you post menopausal?       no                                            do you still have your menstrual cycle? no      Do you take any blood-thinning medications such as: (aspirin, warfarin, Plavix, Aggrenox)  no  If yes we need the name, milligram, dosage and who is prescribing doctor  Current Outpatient Medications on File Prior to Visit  Medication Sig Dispense Refill   albuterol (VENTOLIN HFA) 108 (90 Base) MCG/ACT inhaler SMARTSIG:1 Puff(s) By Mouth Every 6 Hours PRN     BREO ELLIPTA 100-25 MCG/ACT AEPB 1 puff daily.     meclizine (ANTIVERT) 12.5 MG tablet Take 12.5 mg by mouth 3 (three) times daily as needed.     sodium bicarbonate 650 MG tablet Take 650 mg by mouth daily.     cholecalciferol (VITAMIN D3) 25 MCG (1000 UNIT) tablet Take 1,000 Units by mouth daily.     olmesartan (BENICAR) 5 MG tablet Take by mouth daily.     No current facility-administered medications on file prior to visit.    No Known Allergies   Pharmacy: [facility]  Primary Insurance Name: [employer]  number where you can be reached: [phone]

## 2023-12-13 NOTE — Telephone Encounter (Signed)
 Ok to schedule.  Room 1/2  Thanks,  Vista Lawman, MD Gastroenterology and Hepatology [facility]

## 2023-12-17 MED ORDER — PEG 3350-KCL-NA BICARB-NACL 420 G PO SOLR: 4000.0000 mL | Freq: Once | ORAL | 0 refills | Status: AC

## 2023-12-17 NOTE — Telephone Encounter (Signed)
 Pt returned call and scheduled for 01/02/24. Prep sent to pharmacy. No PA needed per [employer]. Instructions mailed to pt

## 2023-12-17 NOTE — Addendum Note (Signed)
 Addended by: Marlowe Shores on: 12/17/2023 12:13 PM   Modules accepted: Orders

## 2023-12-17 NOTE — Telephone Encounter (Signed)
 Left message to return call

## 2023-12-18 ENCOUNTER — Encounter (INDEPENDENT_AMBULATORY_CARE_PROVIDER_SITE_OTHER): Payer: Self-pay | Admitting: *Deleted

## 2023-12-18 NOTE — Telephone Encounter (Signed)
 Referral completed, [facility] apt letter sent to PCP

## 2024-01-01 ENCOUNTER — Other Ambulatory Visit (HOSPITAL_COMMUNITY): Payer: Self-pay | Admitting: Internal Medicine

## 2024-01-01 DIAGNOSIS — E041 Nontoxic single thyroid nodule: Secondary | ICD-10-CM

## 2024-01-02 ENCOUNTER — Ambulatory Visit (HOSPITAL_COMMUNITY): Admitting: Anesthesiology

## 2024-01-02 ENCOUNTER — Ambulatory Visit (HOSPITAL_BASED_OUTPATIENT_CLINIC_OR_DEPARTMENT_OTHER): Admitting: Anesthesiology

## 2024-01-02 ENCOUNTER — Telehealth (INDEPENDENT_AMBULATORY_CARE_PROVIDER_SITE_OTHER): Payer: Self-pay | Admitting: Gastroenterology

## 2024-01-02 ENCOUNTER — Encounter (HOSPITAL_COMMUNITY): Payer: Self-pay | Admitting: Gastroenterology

## 2024-01-02 ENCOUNTER — Encounter (HOSPITAL_COMMUNITY)
Admission: RE | Disposition: A | Discharge status: Home / Self Care | Payer: Self-pay | Source: Home / Self Care | Attending: Gastroenterology

## 2024-01-02 ENCOUNTER — Other Ambulatory Visit: Payer: Self-pay

## 2024-01-02 ENCOUNTER — Ambulatory Visit (HOSPITAL_COMMUNITY)
Admission: RE | Admit: 2024-01-02 | Discharge: 2024-01-02 | Disposition: A | Discharge status: Home / Self Care | Attending: Gastroenterology | Admitting: Gastroenterology

## 2024-01-02 DIAGNOSIS — K649 Unspecified hemorrhoids: Secondary | ICD-10-CM

## 2024-01-02 DIAGNOSIS — D120 Benign neoplasm of cecum: Secondary | ICD-10-CM | POA: Insufficient documentation

## 2024-01-02 DIAGNOSIS — C186 Malignant neoplasm of descending colon: Secondary | ICD-10-CM | POA: Insufficient documentation

## 2024-01-02 DIAGNOSIS — Z1211 Encounter for screening for malignant neoplasm of colon: Secondary | ICD-10-CM | POA: Diagnosis present

## 2024-01-02 DIAGNOSIS — D122 Benign neoplasm of ascending colon: Secondary | ICD-10-CM | POA: Diagnosis not present

## 2024-01-02 DIAGNOSIS — Z139 Encounter for screening, unspecified: Secondary | ICD-10-CM | POA: Diagnosis not present

## 2024-01-02 DIAGNOSIS — D123 Benign neoplasm of transverse colon: Secondary | ICD-10-CM | POA: Insufficient documentation

## 2024-01-02 DIAGNOSIS — I10 Essential (primary) hypertension: Secondary | ICD-10-CM | POA: Insufficient documentation

## 2024-01-02 DIAGNOSIS — F17210 Nicotine dependence, cigarettes, uncomplicated: Secondary | ICD-10-CM | POA: Diagnosis not present

## 2024-01-02 DIAGNOSIS — D125 Benign neoplasm of sigmoid colon: Secondary | ICD-10-CM

## 2024-01-02 DIAGNOSIS — K648 Other hemorrhoids: Secondary | ICD-10-CM

## 2024-01-02 DIAGNOSIS — K635 Polyp of colon: Secondary | ICD-10-CM | POA: Diagnosis not present

## 2024-01-02 DIAGNOSIS — J449 Chronic obstructive pulmonary disease, unspecified: Secondary | ICD-10-CM | POA: Diagnosis not present

## 2024-01-02 DIAGNOSIS — K6389 Other specified diseases of intestine: Secondary | ICD-10-CM

## 2024-01-02 HISTORY — DX: Essential (primary) hypertension: I10

## 2024-01-02 HISTORY — PX: COLONOSCOPY: SHX5424

## 2024-01-02 HISTORY — DX: Chronic obstructive pulmonary disease, unspecified: J44.9

## 2024-01-02 LAB — HM COLONOSCOPY

## 2024-01-02 SURGERY — COLONOSCOPY
Anesthesia: General

## 2024-01-02 MED ORDER — LACTATED RINGERS IV SOLN: INTRAVENOUS | Status: DC

## 2024-01-02 MED ORDER — SPOT INK MARKER SYRINGE KIT
PACK | SUBMUCOSAL | Status: AC
  Filled 2024-01-02: qty 5

## 2024-01-02 MED ORDER — GLYCOPYRROLATE PF 0.2 MG/ML IJ SOSY
PREFILLED_SYRINGE | INTRAMUSCULAR | Status: AC
Start: 2024-01-02 — End: ?
  Filled 2024-01-02: qty 1

## 2024-01-02 MED ORDER — PROPOFOL 10 MG/ML IV BOLUS
INTRAVENOUS | Status: DC | PRN
  Administered 2024-01-02: 100 mg via INTRAVENOUS

## 2024-01-02 MED ORDER — PROPOFOL 500 MG/50ML IV EMUL
INTRAVENOUS | Status: DC | PRN
  Administered 2024-01-02: 150 ug/kg/min via INTRAVENOUS

## 2024-01-02 MED ORDER — GLYCOPYRROLATE PF 0.2 MG/ML IJ SOSY
PREFILLED_SYRINGE | INTRAMUSCULAR | Status: DC | PRN
Start: 2024-01-02 — End: 2024-01-02
  Administered 2024-01-02: .2 mg via INTRAVENOUS

## 2024-01-02 MED ORDER — SPOT INK MARKER SYRINGE KIT
PACK | SUBMUCOSAL | Status: DC | PRN
  Administered 2024-01-02: 4 mL via SUBMUCOSAL

## 2024-01-02 MED ORDER — SODIUM CHLORIDE 0.9% FLUSH
INTRAVENOUS | Status: AC
Start: 2024-01-02 — End: ?
  Filled 2024-01-02: qty 10

## 2024-01-02 MED ORDER — LIDOCAINE 2% (20 MG/ML) 5 ML SYRINGE
INTRAMUSCULAR | Status: DC | PRN
Start: 2024-01-02 — End: 2024-01-02
  Administered 2024-01-02: 50 mg via INTRAVENOUS

## 2024-01-02 MED ORDER — LACTATED RINGERS IV SOLN: INTRAVENOUS | Status: DC | PRN

## 2024-01-02 NOTE — Telephone Encounter (Signed)
 Hargis Lias, MD  Cyril Driver, Bertell Broach, LPN Hi Bertell Broach,  Can you please schedule a CT chest abdomen and pelvis ASAP? Dx: colon cancer.  Hi Ann  Can you please refer this patient to colorectal surgeon and our oncologist   Thanks,  Muhammad Faizan Ahmed, MD Gastroenterology and Hepatology [facility] Gastroenterology

## 2024-01-02 NOTE — Addendum Note (Signed)
 Addended by: Arel Tippen on: 01/02/2024 03:28 PM   Modules accepted: Orders

## 2024-01-02 NOTE — Telephone Encounter (Signed)
 CT chest/abd/pelvis scheduled (original order was put in as routine, had to change to STAT). Pt will also need bun/creatine. Lab order placed. Pt scheduled for tomorrow at 11:30am-pt to arrive at 9:15am to register, she will drink at 9:30 and 10:30; pt will be scanned at 11:30. Pt contacted and made aware of CT appt.

## 2024-01-02 NOTE — Telephone Encounter (Signed)
 Auth number [id number] Valid Dates-01/02/24-06/30/24  PA completed via [employer]

## 2024-01-02 NOTE — Transfer of Care (Signed)
 Immediate Anesthesia Transfer of Care Note  Patient: Tara Morgan  Procedure(s) Performed: COLONOSCOPY  Patient Location: Endoscopy Unit  Anesthesia Type:General  Level of Consciousness: drowsy  Airway & Oxygen Therapy: Patient Spontanous Breathing  Post-op Assessment: Report given to RN and Post -op Vital signs reviewed and stable  Post vital signs: Reviewed and stable  Last Vitals:  Vitals Value Taken Time  BP    Temp    Pulse    Resp    SpO2      Last Pain:  Vitals:   01/02/24 1327  TempSrc:   PainSc: 0-No pain      Patients Stated Pain Goal: 10 (01/02/24 1314)  Complications: No notable events documented.

## 2024-01-02 NOTE — Discharge Instructions (Signed)

## 2024-01-02 NOTE — Anesthesia Preprocedure Evaluation (Signed)
 Anesthesia Evaluation  Patient identified by MRN, date of birth, ID band Patient awake    Reviewed: Allergy & Precautions, H&P , NPO status , Patient's Chart, lab work & pertinent test results, reviewed documented beta blocker date and time   Airway Mallampati: II  TM Distance: >3 FB Neck ROM: full    Dental no notable dental hx.    Pulmonary neg pulmonary ROS, Current Smoker   Pulmonary exam normal breath sounds clear to auscultation       Cardiovascular Exercise Tolerance: Good hypertension, negative cardio ROS  Rhythm:regular Rate:Normal     Neuro/Psych negative neurological ROS  negative psych ROS   GI/Hepatic negative GI ROS, Neg liver ROS,,,  Endo/Other  negative endocrine ROS    Renal/GU negative Renal ROS  negative genitourinary   Musculoskeletal   Abdominal   Peds  Hematology negative hematology ROS (+)   Anesthesia Other Findings   Reproductive/Obstetrics negative OB ROS                             Anesthesia Physical Anesthesia Plan  ASA: 2  Anesthesia Plan: General   Post-op Pain Management:    Induction:   PONV Risk Score and Plan: Propofol infusion  Airway Management Planned:   Additional Equipment:   Intra-op Plan:   Post-operative Plan:   Informed Consent: I have reviewed the patients History and Physical, chart, labs and discussed the procedure including the risks, benefits and alternatives for the proposed anesthesia with the patient or authorized representative who has indicated his/her understanding and acceptance.     Dental Advisory Given  Plan Discussed with: CRNA  Anesthesia Plan Comments:        Anesthesia Quick Evaluation

## 2024-01-02 NOTE — H&P (Signed)
 Primary Care Physician:  Veda Gerald, MD Primary Gastroenterologist:  Dr. Alita Irwin  Pre-Procedure History & Physical: HPI:  Tara Morgan is a 63 y.o. female is here for a colonoscopy for colon cancer screening purposes.  Patient denies any family history of colorectal cancer.  No melena or hematochezia.  No abdominal pain or unintentional weight loss.  No change in bowel habits.  Overall feels well from a GI standpoint.  Past Medical History:  Diagnosis Date   COPD (chronic obstructive pulmonary disease) (HCC)    Hypertension     Past Surgical History:  Procedure Laterality Date   BREAST BIOPSY Right 04/02/2017   FIBROADENFIBROCYSTIC CHANGES AND ADENOSIS WITH CALCIFICATIONS   CATARACT EXTRACTION W/PHACO Left 01/03/2016   Procedure: CATARACT EXTRACTION PHACO AND INTRAOCULAR LENS PLACEMENT (IOC);  Surgeon: Anner Kill, MD;  Location: [facility];  Service: Ophthalmology;  Laterality: Left;  CDE:5.86   CATARACT EXTRACTION W/PHACO Right 02/21/2016   Procedure: CATARACT EXTRACTION PHACO AND INTRAOCULAR LENS PLACEMENT RIGHT EYE; CDE:  6.40;  Surgeon: Anner Kill, MD;  Location: [facility];  Service: Ophthalmology;  Laterality: Right;   CHOLECYSTECTOMY     JOINT REPLACEMENT Left 2022   hip replacement    Prior to Admission medications   Medication Sig Start Date End Date Taking? Authorizing Provider  cholecalciferol (VITAMIN D3) 25 MCG (1000 UNIT) tablet Take 1,000 Units by mouth daily.   Yes [provider]  meclizine (ANTIVERT) 12.5 MG tablet Take 12.5 mg by mouth 3 (three) times daily as needed. 11/12/23  Yes [provider]  olmesartan (BENICAR) 5 MG tablet Take by mouth daily.   Yes [provider]  sodium bicarbonate 650 MG tablet Take 650 mg by mouth daily. 11/20/23  Yes [provider]  albuterol (VENTOLIN HFA) 108 (90 Base) MCG/ACT inhaler SMARTSIG:1 Puff(s) By Mouth Every 6 Hours PRN 11/12/23   [provider]  BREO ELLIPTA 100-25 MCG/ACT AEPB 1 puff  daily. 11/12/23   [provider]    Allergies as of 12/17/2023   (No Known Allergies)    Family History  Problem Relation Age of Onset   Dementia Mother    Heart failure Father     Social History   Socioeconomic History   Marital status: Widowed    Spouse name: Not on file   Number of children: Not on file   Years of education: Not on file   Highest education level: Not on file  Occupational History   Not on file  Tobacco Use   Smoking status: Every Day    Current packs/day: 1.00    Average packs/day: 1 pack/day for 45.0 years (45.0 ttl pk-yrs)    Types: Cigarettes   Smokeless tobacco: Never  Vaping Use   Vaping status: Every Day  Substance and Sexual Activity   Alcohol use: Yes    Comment: 2 times week   Drug use: No   Sexual activity: Yes    Birth control/protection: None  Other Topics Concern   Not on file  Social History Narrative   Not on file   Social Drivers of Health   Financial Resource Strain: Not on file  Food Insecurity: Not on file  Transportation Needs: Not on file  Physical Activity: Not on file  Stress: Not on file  Social Connections: Not on file  Intimate Partner Violence: Not on file    Review of Systems: See HPI, otherwise negative ROS  Physical Exam: Vital signs in last 24 hours: Temp:  [98.7 F (  37.1 C)] 98.7 F (37.1 C) (04/23 1314) Pulse Rate:  [51] 51 (04/23 1314) Resp:  [17] 17 (04/23 1314) BP: (184)/(74) 184/74 (04/23 1314) SpO2:  [100 %] 100 % (04/23 1314) Weight:  [72.6 kg] 72.6 kg (04/23 1314)   General:   Alert,  Well-developed, well-nourished, pleasant and cooperative in NAD Head:  Normocephalic and atraumatic. Eyes:  Sclera clear, no icterus.   Conjunctiva pink. Ears:  Normal auditory acuity. Nose:  No deformity, discharge,  or lesions. Msk:  Symmetrical without gross deformities. Normal posture. Extremities:  Without clubbing or edema. Neurologic:  Alert and  oriented x4;  grossly normal  neurologically. Skin:  Intact without significant lesions or rashes. Psych:  Alert and cooperative. Normal mood and affect.  Impression/Plan: Tara Morgan is here for a colonoscopy to be performed for colon cancer screening purposes.  The risks of the procedure including infection, bleed, or perforation as well as benefits, limitations, alternatives and imponderables have been reviewed with the patient. Questions have been answered. All parties agreeable.

## 2024-01-02 NOTE — Op Note (Addendum)
 [facility] Patient Name: Tara Morgan Procedure Date: 01/02/2024 1:19 PM MRN: [id number] Date of Birth: 13-Sep-1960 Attending MD: Terril Fetters , MD, [id number] CSN: [id number] Age: 63 Admit Type: Outpatient Procedure:                Colonoscopy Indications:              Screening for colorectal malignant neoplasm Providers:                Terril Fetters, MD, Crystal Page, Jolee Naval                            Tech., Technician Referring MD:              Medicines:                Monitored Anesthesia Care Complications:            No immediate complications. Estimated Blood Loss:     Estimated blood loss was minimal. Procedure:                Pre-Anesthesia Assessment:                           - Prior to the procedure, a History and Physical                            was performed, and patient medications and                            allergies were reviewed. The patient's tolerance of                            previous anesthesia was also reviewed. The risks                            and benefits of the procedure and the sedation                            options and risks were discussed with the patient.                            All questions were answered, and informed consent                            was obtained. Prior Anticoagulants: The patient has                            taken no anticoagulant or antiplatelet agents. ASA                            Grade Assessment: II - A patient with mild systemic                            disease. After reviewing the risks and benefits,  the patient was deemed in satisfactory condition to                            undergo the procedure.                           After obtaining informed consent, the colonoscope                            was passed under direct vision. Throughout the                            procedure, the patient's blood pressure, pulse, and                             oxygen saturations were monitored continuously. The                            [id number]) scope was introduced through the                            anus and advanced to the the cecum, identified by                            appendiceal orifice and ileocecal valve. The                            colonoscopy was technically difficult and complex.                            The patient tolerated the procedure well. The                            quality of the bowel preparation was evaluated                            using the BBPS [facility] Bowel Preparation Scale)                            with scores of: Right Colon = 3, Transverse Colon =                            3 and Left Colon = 3 (entire mucosa seen well with                            no residual staining, small fragments of stool or                            opaque liquid). The total BBPS score equals 9. The                            ileocecal valve, appendiceal orifice, and rectum  were photographed.                           22 Modifier used given complexity and length of                            case ( over 1 hour) requiring multiple EMR Scope In: 1:33:14 PM Scope Out: 2:35:17 PM Scope Withdrawal Time: 0 hours 59 minutes 3 seconds  Total Procedure Duration: 1 hour 2 minutes 3 seconds  Findings:      The perianal and digital rectal examinations were normal.      A fungating, polypoid and ulcerated non-obstructing large mass was found       in the descending colon. The mass was partially circumferential       (involving one-half of the lumen circumference). No bleeding was       present. This was biopsied with a cold large-capacity forceps for       histology. Area was tattooed with an injection of 4 mL of Spot (carbon       black).      Nine sessile polyps were found in the sigmoid colon, transverse colon,       ascending colon and cecum. The polyps were 3 to 10 mm in size. These        polyps were removed with a cold snare. Resection and retrieval were       complete.      A 20 mm polyp was found in the ascending colon. The polyp was sessile.       Preparations were made for mucosal resection. Demarcation of the lesion       was performed with high-definition white light and narrow band imaging       to clearly identify the boundaries of the lesion. Eleview was injected       to raise the lesion. Piecemeal mucosal resection using a snare was       performed. Resection and retrieval were complete. Resected tissue       margins were examined and clear of polyp tissue.      A 25 mm polyp was found in the transverse colon. The polyp was sessile.       Preparations were made for mucosal resection. Demarcation of the lesion       was performed with high-definition white light and narrow band imaging       to clearly identify the boundaries of the lesion. Eleview was injected       to raise the lesion. Piecemeal mucosal resection using a snare was       performed. Resection and retrieval were complete. Resected tissue       margins were examined and clear of polyp tissue.      A 15 mm polyp was found in the transverse colon. The polyp was sessile.       Preparations were made for mucosal resection. Demarcation of the lesion       was performed with high-definition white light and narrow band imaging       to clearly identify the boundaries of the lesion. Eleview was injected       to raise the lesion. Snare mucosal resection was performed. Resection       and retrieval were complete. Resected tissue margins were examined and       clear of polyp tissue.  Non-bleeding internal hemorrhoids were found during retroflexion. The       hemorrhoids were small. Impression:               - Likely malignant tumor in the descending colon.                            Biopsied. Tattooed. 50cm from anal verge                           - Nine 3 to 10 mm polyps in the sigmoid colon, in                             the transverse colon, in the ascending colon and in                            the cecum, removed with a cold snare. Resected and                            retrieved.                           - One 20 mm polyp in the ascending colon, removed                            with mucosal resection. Resected and retrieved.                           - One 25 mm polyp in the transverse colon, removed                            with mucosal resection. Resected and retrieved.                           - One 15 mm polyp in the transverse colon, removed                            with mucosal resection. Resected and retrieved.                           - Non-bleeding internal hemorrhoids.                           - Mucosal resection was performed. Resection and                            retrieval were complete.                           - Mucosal resection was performed. Resection and                            retrieval were complete.                           -  Mucosal resection was performed. Resection and                            retrieval were complete.                           -PURASTAT applied to all EMR site to prevent                            bleeding given large right colon polyp resection Moderate Sedation:      Per Anesthesia Care Recommendation:           - Patient has a contact number available for                            emergencies. The signs and symptoms of potential                            delayed complications were discussed with the                            patient. Return to normal activities tomorrow.                            Written discharge instructions were provided to the                            patient.                           - High fiber diet.                           - Continue present medications.                           - Await pathology results.                           -CT Chest abdomen and pelvis for staging                            - Repeat colonoscopy in 6 months for surveillance                            based on pathology results, given multiple                            piecemeal resection                           - Refer to a surgeon at appointment to be                            scheduled. Oncology referral                           -  If patient proceeds with surgery , would recommend                            extended colectomy given large colon burden and                            piecemeal resection Procedure Code(s):        --- Professional ---                           [phone], 22, Colonoscopy, flexible; with endoscopic                            mucosal resection                           45385, 59, Colonoscopy, flexible; with removal of                            tumor(s), polyp(s), or other lesion(s) by snare                            technique                           45380, 59, Colonoscopy, flexible; with biopsy,                            single or multiple                           45381, 59, Colonoscopy, flexible; with directed                            submucosal injection(s), any substance Diagnosis Code(s):        --- Professional ---                           Z12.11, Encounter for screening for malignant                            neoplasm of colon                           D49.0, Neoplasm of unspecified behavior of                            digestive system                           D12.5, Benign neoplasm of sigmoid colon                           D12.3, Benign neoplasm of transverse colon (hepatic                            flexure or splenic flexure)  D12.2, Benign neoplasm of ascending colon                           D12.0, Benign neoplasm of cecum                           K64.8, Other hemorrhoids CPT copyright 2022 American [employer]. All rights reserved. The codes documented in this report are preliminary and upon coder  review may  be revised to meet current compliance requirements. Terril Fetters, MD Terril Fetters, MD 01/02/2024 2:46:31 PM This report has been signed electronically. Number of Addenda: 0

## 2024-01-03 ENCOUNTER — Encounter (INDEPENDENT_AMBULATORY_CARE_PROVIDER_SITE_OTHER): Payer: Self-pay | Admitting: *Deleted

## 2024-01-03 ENCOUNTER — Other Ambulatory Visit (HOSPITAL_COMMUNITY)

## 2024-01-03 ENCOUNTER — Ambulatory Visit (HOSPITAL_COMMUNITY)
Admission: RE | Admit: 2024-01-03 | Discharge: 2024-01-03 | Disposition: A | Discharge status: Home / Self Care | Source: Ambulatory Visit | Attending: Gastroenterology | Admitting: Gastroenterology

## 2024-01-03 ENCOUNTER — Telehealth (INDEPENDENT_AMBULATORY_CARE_PROVIDER_SITE_OTHER): Payer: Self-pay | Admitting: *Deleted

## 2024-01-03 ENCOUNTER — Encounter (HOSPITAL_COMMUNITY): Payer: Self-pay | Admitting: Gastroenterology

## 2024-01-03 DIAGNOSIS — E041 Nontoxic single thyroid nodule: Secondary | ICD-10-CM | POA: Diagnosis not present

## 2024-01-03 DIAGNOSIS — C186 Malignant neoplasm of descending colon: Secondary | ICD-10-CM | POA: Diagnosis present

## 2024-01-03 LAB — POCT I-STAT CREATININE: Creatinine, Ser: 0.7 mg/dL (ref 0.44–1.00)

## 2024-01-03 MED ORDER — IOHEXOL 9 MG/ML PO SOLN
ORAL | Status: AC
  Filled 2024-01-03: qty 1000

## 2024-01-03 MED ORDER — IOHEXOL 300 MG/ML  SOLN
100.0000 mL | Freq: Once | INTRAMUSCULAR | Status: AC | PRN
  Administered 2024-01-03: 100 mL via INTRAVENOUS

## 2024-01-03 NOTE — Telephone Encounter (Signed)
-----   Message from Hargis Lias sent at 01/02/2024  2:47 PM EDT ----- Regarding: reff and CT Hi Tanya,   Can you please schedule a CT chest abdomen and pelvis ASAP? Dx: colon cancer.  Hi Kaiyan Luczak  Can you please refer this patient to colorectal surgeon and our oncologist    Thanks,  Muhammad Faizan Ahmed, MD Gastroenterology and Hepatology [facility]

## 2024-01-03 NOTE — Telephone Encounter (Signed)
 Referral sent, they will contact patient with apt

## 2024-01-04 LAB — SURGICAL PATHOLOGY

## 2024-01-04 NOTE — Anesthesia Postprocedure Evaluation (Signed)
 Anesthesia Post Note  Patient: Tara Morgan  Procedure(s) Performed: COLONOSCOPY  Patient location during evaluation: Phase II Anesthesia Type: General Level of consciousness: awake Pain management: pain level controlled Vital Signs Assessment: post-procedure vital signs reviewed and stable Respiratory status: spontaneous breathing and respiratory function stable Cardiovascular status: blood pressure returned to baseline and stable Postop Assessment: no headache and no apparent nausea or vomiting Anesthetic complications: no Comments: Late entry   No notable events documented.   Last Vitals:  Vitals:   01/02/24 1314 01/02/24 1438  BP: (!) 184/74 (!) 148/54  Pulse: (!) 51 (!) 50  Resp: 17 18  Temp: 37.1 C 36.4 C  SpO2: 100% 99%    Last Pain:  Vitals:   01/02/24 1438  TempSrc: Oral  PainSc: 0-No pain                 Coretha Dew

## 2024-01-07 NOTE — Progress Notes (Signed)
 I reviewed the pathology results. I spoke to the patient in detail over the phone about the diagnosis of diagnosis of colon cancer   Repeat colonoscopy in 6 months after surgery   Tara Morgan can you please make sure colorectal surgeons have her referral as no one has contacted her   Urgent referral for colon cancer  Also please send the report and path to PCP   Thanks,  Tara Vanlue Faizan Evey Mcmahan, MD Gastroenterology and Hepatology [facility]enter[facility]  --------------------------------------------------------------------------------------------- Path :   A. COLON, CECAL, ASCENDING, HEPATIC FLEXTURE, TRANSVERSE, POLYPECTOMY:  - Tubular adenoma(s)  - Negative for high-grade dysplasia or malignancy   B. COLON MASS, DESCENDING, BIOPSY:  - At least intramucosal adenocarcinoma, see comment    Even though path shows intramucosal adenocarcinoma , this is colon cancer given large colon ulcerated mass. Patient verbalizes understanding next step is to see oncologist and colorectal surgeons . Staging CT was completed  Patient verbalizes the date of next clinic appointment with oncology

## 2024-01-09 NOTE — Progress Notes (Signed)
 Thank you for taking care of this and getting patient scheduled to see surgeons

## 2024-01-09 NOTE — Progress Notes (Signed)
 [facility] [address] [place] [address]   Clinic Day:  01/10/2024  Referring physician: Veda Gerald, MD  Patient Care Team: Veda Gerald, MD as PCP - General (Internal Medicine)   ASSESSMENT & PLAN:   Assessment:  1.  Descending colon cancer: - Screening colonoscopy (01/02/2024): Fungating polypoid and ulcerated nonobstructing large mass found in the descending colon, partially circumferential. - Pathology: Tubular adenoma in the cecal, ascending and hepatic flexure polyps.  Descending colon mass biopsy shows at least intramucosal adenocarcinoma. - CT CAP (01/03/2024): Slight wall thickening and stranding along the splenic flexure, proximal descending colon.  Small bubble of air along the wall of the bowel.  No lymphadenopathy or metastatic disease.  Several small subcentimeter lung nodules seen which are similar to lung cancer screening scan from March 2025.   2. [name] Independent of ADL's and IADL's. Tobacco use of 1 ppd since age 29. No asbestos exposure. No chemical exposures.  -Father had prostate cancer and lung cancer. Brother had liver cancer. Maternal aunt had breast cancer. Paternal uncle had brain brain cancer.   Plan:  1.  Highly likely descending colon cancer: - Even though biopsy does not confirm, this mass is malignant.  We have reviewed imaging studies and colonoscopy in detail.  She is meeting with Dr. Camilo Cella on Monday. - I will obtain a baseline CEA and check routine labs today. - She may proceed with left hemicolectomy.  RTC 3 weeks for follow-up when we will review her path report and discuss if she needs adjuvant therapy.   Orders Placed This Encounter  Procedures   CBC with Differential    Standing Status:   Future    Number of Occurrences:   1    Expected Date:   01/10/2024    Expiration Date:   01/09/2025   Comprehensive metabolic panel    Standing Status:   Future    Number of  Occurrences:   1    Expected Date:   01/10/2024    Expiration Date:   01/09/2025   CEA    Standing Status:   Future    Number of Occurrences:   1    Expected Date:   01/10/2024    Expiration Date:   01/09/2025   Iron and TIBC ([facility] ONLY)    Standing Status:   Future    Number of Occurrences:   1    Expected Date:   01/10/2024    Expiration Date:   01/09/2025   Ferritin    Standing Status:   Future    Number of Occurrences:   1    Expected Date:   01/10/2024    Expiration Date:   01/09/2025      Hurman Maiden R Teague,acting as a scribe for Paulett Boros, MD.,have documented all relevant documentation on the behalf of Paulett Boros, MD,as directed by  Paulett Boros, MD while in the presence of Paulett Boros, MD.   I, Paulett Boros MD, have reviewed the above documentation for accuracy and completeness, and I agree with the above.   Paulett Boros, MD   5/1/202510:33 AM  CHIEF COMPLAINT/PURPOSE OF CONSULT:   Diagnosis: Descending colon cancer   Cancer Staging  No matching staging information was found for the patient.    Prior Therapy: None  Current Therapy: Under workup   HISTORY OF PRESENT ILLNESS:   Oncology History   No history exists.  Enriqueta is a 63 y.o. female presenting to clinic today for evaluation of descending colon cancer at the request of Hargis Lias, MD.  Patient underwent colonoscopy on 01/02/24 with Dr. Alita Irwin, which found a fungating, polypoid and ulcerated non-obstructing large mass in the descending colon, 50 cm from anal verge. The mass was partially circumferential and was not bleeding. Nine sessile polyps, 3 to 10 mm in size, were found in the sigmoid colon, transverse colon, ascending colon, and cecum. A 20 mm sessile polyp was found in the ascending colon. A 25 mm sessile polyp was found in the transverse colon. A 15 mm sessile polyp was found in the transverse colon. Non-bleeding internal  hemorrhoids were also found.  Pathology of the descending colon mass revealed: at least intramucosal adenocarcinoma, though depth of invasion cannot be judged due to superficial nature of the biopsies. The polypectomies showed: tubular adenomas, negative for high-grade dysplasia or malignancy.   Shaughnessy had CT CAP on 01/03/24 that found: Slight wall thickening and stranding along the splenic flexure, proximal descending colon.There is a small bubble of air along the wall of the bowel in this location medially. Favor this being a diverticula with air rather than a bubble of free air. No signs of obstruction. No free-fluid. No widespread free air. No developing lymph node enlargement. Tiny hepatic cystic focus. No separate space-occupying liver lesion.  Today, she states that she is doing well overall. Her appetite level is at 75%. Her energy level is at 0%. She is accompanied by family members. [place] denies any pain after colonoscopy. She has an appointment with surgery, Dr. Camilo Cella, in [place] on 01/14/24. She denies any BRBPR or melena prior to colonoscopy. She denies any unintentional weight loss or new onset pains, including abdominal pain, in the last 6 months. No history of MI's, CVA's, or TIA's.   Kaaren reports normal blood pressure at home and states she takes blood pressure medication. She had her PCP changed to Dr. Harrie Limb a few months ago.   PAST MEDICAL HISTORY:   Past Medical History: Past Medical History:  Diagnosis Date   COPD (chronic obstructive pulmonary disease) (HCC)    Hypertension     Surgical History: Past Surgical History:  Procedure Laterality Date   BREAST BIOPSY Right 04/02/2017   FIBROADENFIBROCYSTIC CHANGES AND ADENOSIS WITH CALCIFICATIONS   CATARACT EXTRACTION W/PHACO Left 01/03/2016   Procedure: CATARACT EXTRACTION PHACO AND INTRAOCULAR LENS PLACEMENT (IOC);  Surgeon: Anner Kill, MD;  Location: [facility];  Service: Ophthalmology;  Laterality: Left;  CDE:5.86   CATARACT  EXTRACTION W/PHACO Right 02/21/2016   Procedure: CATARACT EXTRACTION PHACO AND INTRAOCULAR LENS PLACEMENT RIGHT EYE; CDE:  6.40;  Surgeon: Anner Kill, MD;  Location: [facility];  Service: Ophthalmology;  Laterality: Right;   CHOLECYSTECTOMY     COLONOSCOPY N/A 01/02/2024   Procedure: COLONOSCOPY;  Surgeon: Hargis Lias, MD;  Location: [facility];  Service: Endoscopy;  Laterality: N/A;  1:30PM;ASA 1-2   JOINT REPLACEMENT Left 2022   hip replacement    Social History: Social History   Socioeconomic History   Marital status: Widowed    Spouse name: Not on file   Number of children: Not on file   Years of education: Not on file   Highest education level: Not on file  Occupational History   Not on file  Tobacco Use   Smoking status: Every Day    Current packs/day: 1.00    Average packs/day: 1 pack/day for 45.0 years (45.0 ttl pk-yrs)  Types: Cigarettes   Smokeless tobacco: Never  Vaping Use   Vaping status: Every Day  Substance and Sexual Activity   Alcohol use: Yes    Comment: 2 times week   Drug use: No   Sexual activity: Yes    Birth control/protection: None  Other Topics Concern   Not on file  Social History Narrative   Not on file   Social Drivers of Health   Financial Resource Strain: Not on file  Food Insecurity: No Food Insecurity (01/10/2024)   Hunger Vital Sign    Worried About Running Out of Food in the Last Year: Never true    Ran Out of Food in the Last Year: Never true  Transportation Needs: No Transportation Needs (01/10/2024)   PRAPARE - Administrator, Civil Service (Medical): No    Lack of Transportation (Non-Medical): No  Physical Activity: Not on file  Stress: Not on file  Social Connections: Not on file  Intimate Partner Violence: Not At Risk (01/10/2024)   Humiliation, Afraid, Rape, and Kick questionnaire    Fear of Current or Ex-Partner: No    Emotionally Abused: No    Physically Abused: No    Sexually Abused: No    Family  History: Family History  Problem Relation Age of Onset   Dementia Mother    Heart failure Father     Current Medications:  Current Outpatient Medications:    albuterol (VENTOLIN HFA) 108 (90 Base) MCG/ACT inhaler, SMARTSIG:1 Puff(s) By Mouth Every 6 Hours PRN, Disp: , Rfl:    BREO ELLIPTA 100-25 MCG/ACT AEPB, 1 puff daily., Disp: , Rfl:    cholecalciferol (VITAMIN D3) 25 MCG (1000 UNIT) tablet, Take 1,000 Units by mouth daily., Disp: , Rfl:    gabapentin  (NEURONTIN ) 100 MG capsule, Take 100 mg by mouth daily., Disp: , Rfl:    olmesartan (BENICAR) 40 MG tablet, Take 40 mg by mouth daily., Disp: , Rfl:    sodium bicarbonate 650 MG tablet, Take 650 mg by mouth daily., Disp: , Rfl:    meclizine (ANTIVERT) 12.5 MG tablet, Take 12.5 mg by mouth 3 (three) times daily as needed. (Patient not taking: Reported on 01/10/2024), Disp: , Rfl:    Allergies: No Known Allergies  REVIEW OF SYSTEMS:   Review of Systems  Constitutional:  Negative for chills, fatigue and fever.  HENT:   Negative for lump/mass, mouth sores, nosebleeds, sore throat and trouble swallowing.   Eyes:  Negative for eye problems.  Respiratory:  Negative for cough and shortness of breath.   Cardiovascular:  Negative for chest pain, leg swelling and palpitations.  Gastrointestinal:  Positive for blood in stool and diarrhea. Negative for abdominal pain, constipation, nausea and vomiting.  Genitourinary:  Negative for bladder incontinence, difficulty urinating, dysuria, frequency, hematuria and nocturia.   Musculoskeletal:  Negative for arthralgias, back pain, flank pain, myalgias and neck pain.  Skin:  Negative for itching and rash.  Neurological:  Negative for dizziness, headaches and numbness.  Hematological:  Does not bruise/bleed easily.  Psychiatric/Behavioral:  Positive for sleep disturbance. Negative for depression and suicidal ideas. The patient is not nervous/anxious.   All other systems reviewed and are negative.     VITALS:   Blood pressure (!) 192/81, pulse (!) 50, temperature 97.9 F (36.6 C), temperature source Oral, resp. rate 16, height 5\' 7"  (1.702 m), weight 164 lb 0.4 oz (74.4 kg), SpO2 100%.  Wt Readings from Last 3 Encounters:  01/10/24 164 lb 0.4 oz (74.4 kg)  01/02/24 160 lb (72.6 kg)  09/25/23 167 lb 12.8 oz (76.1 kg)    Body mass index is 25.69 kg/m.  Performance status (ECOG): 1 - Symptomatic but completely ambulatory  PHYSICAL EXAM:   Physical Exam Vitals and nursing note reviewed. Exam conducted with a chaperone present.  Constitutional:      Appearance: Normal appearance.  Cardiovascular:     Rate and Rhythm: Normal rate and regular rhythm.     Pulses: Normal pulses.     Heart sounds: Normal heart sounds.  Pulmonary:     Effort: Pulmonary effort is normal.     Breath sounds: Normal breath sounds.  Abdominal:     Palpations: Abdomen is soft. There is no hepatomegaly, splenomegaly or mass.     Tenderness: There is no abdominal tenderness.  Musculoskeletal:     Right lower leg: No edema.     Left lower leg: No edema.  Lymphadenopathy:     Cervical: No cervical adenopathy.     Right cervical: No superficial, deep or posterior cervical adenopathy.    Left cervical: No superficial, deep or posterior cervical adenopathy.     Upper Body:     Right upper body: No supraclavicular or axillary adenopathy.     Left upper body: No supraclavicular or axillary adenopathy.  Neurological:     General: No focal deficit present.     Mental Status: She is alert and oriented to person, place, and time.  Psychiatric:        Mood and Affect: Mood normal.        Behavior: Behavior normal.     LABS:   CBC    Component Value Date/Time   WBC 3.9 (L) 01/10/2024 0850   RBC 3.56 (L) 01/10/2024 0850   HGB 12.8 01/10/2024 0850   HCT 38.2 01/10/2024 0850   PLT 191 01/10/2024 0850   MCV 107.3 (H) 01/10/2024 0850   MCH 36.0 (H) 01/10/2024 0850   MCHC 33.5 01/10/2024 0850   RDW  14.8 01/10/2024 0850   LYMPHSABS 1.5 01/10/2024 0850   MONOABS 0.3 01/10/2024 0850   EOSABS 0.1 01/10/2024 0850   BASOSABS 0.0 01/10/2024 0850    CMP    Component Value Date/Time   NA 141 01/10/2024 0850   K 3.6 01/10/2024 0850   CL 107 01/10/2024 0850   CO2 25 01/10/2024 0850   GLUCOSE 105 (H) 01/10/2024 0850   BUN 11 01/10/2024 0850   CREATININE 0.66 01/10/2024 0850   CALCIUM 9.4 01/10/2024 0850   PROT 6.7 01/10/2024 0850   ALBUMIN 3.7 01/10/2024 0850   AST 16 01/10/2024 0850   ALT 13 01/10/2024 0850   ALKPHOS 51 01/10/2024 0850   BILITOT 0.8 01/10/2024 0850   GFRNONAA >60 01/10/2024 0850   GFRAA >60 12/30/2015 1448     No results found for: "CEA1", "CEA" / No results found for: "CEA1", "CEA" No results found for: "PSA1" No results found for: "EAV409" No results found for: "CAN125"  No results found for: "TOTALPROTELP", "ALBUMINELP", "A1GS", "A2GS", "BETS", "BETA2SER", "GAMS", "MSPIKE", "SPEI" Lab Results  Component Value Date   TIBC 267 01/10/2024   FERRITIN 162 01/10/2024   IRONPCTSAT 46 (H) 01/10/2024   No results found for: "LDH"   STUDIES:   CT CHEST ABDOMEN PELVIS W CONTRAST Addendum Date: 01/03/2024 ADDENDUM REPORT: 01/03/2024 13:46 ADDENDUM: The ordering service, Dr. Alita Irwin, is aware of the findings as per the nurse Amalia Badder. The ordering physician was not immediately available for direct consultation but did review the  report for possible small focus of free air or contained leak versus a diverticula. Electronically Signed   By: Adrianna Horde M.D.   On: 01/03/2024 13:46   Result Date: 01/03/2024 CLINICAL DATA:  Staging colon cancer. Malignant neoplasm of descending colon. * Tracking Code: BO * EXAM: CT CHEST, ABDOMEN, AND PELVIS WITH CONTRAST TECHNIQUE: Multidetector CT imaging of the chest, abdomen and pelvis was performed following the standard protocol during bolus administration of intravenous contrast. RADIATION DOSE REDUCTION: This exam was performed  according to the departmental dose-optimization program which includes automated exposure control, adjustment of the mA and/or kV according to patient size and/or use of iterative reconstruction technique. CONTRAST:  OMNIPAQUE  IOHEXOL  300 MG/ML  SOLN COMPARISON:  Lung cancer screening CT scan 11/28/2023 FINDINGS: CT CHEST FINDINGS Cardiovascular: Thoracic aorta is normal course and caliber with partially calcified atherosclerotic plaque. Coronary calcifications are seen. No pericardial effusion. Heart is not enlarged. Mediastinum/Nodes: Slightly patulous thoracic esophagus. Simple appearing cyst along the inferior aspect of the left thyroid lobe measuring 16 mm. No specific abnormal lymph node enlargement identified in the axillary regions, hilum or mediastinum. Lungs/Pleura: 2 mm left upper lobe lung nodule on series 4, image 69 is unchanged from previous. Few other tiny nodules elsewhere. 5 mm nodule right lower lobe image 93 is another example. No consolidation, pneumothorax or effusion. There is some basilar scar or atelectasis. Musculoskeletal: Curvature and degenerative changes along the spine. CT ABDOMEN PELVIS FINDINGS Hepatobiliary: Tiny cystic lesion identified in segment 3 of the liver anteriorly, too small to characterize but likely a benign cystic focus. Patent portal vein. Gallbladder is surgically absent. Pancreas: Unremarkable. No pancreatic ductal dilatation or surrounding inflammatory changes. Spleen: Normal in size without focal abnormality. Adrenals/Urinary Tract: Adrenal glands are preserved. 3.4 cm posterior right-sided simple appearing renal cyst. Hounsfield units of 9. Small focus on the left measures 10 mm. Also benign cystic focus. Bosniak 1 and 2 lesions. No imaging follow-up. The ureters have normal course and caliber extending down to the urinary bladder. Bladder is underdistended. Stomach/Bowel: Stomach is underdistended. Small bowel is nondilated. There is some oral contrast  along the distal small bowel as well as along the ascending and transverse colon. The large bowel is nondilated. There is a normal appendix in the right lower quadrant extending medial to the cecum. Along the proximal descending colon is some nodular wall thickening with stranding along the colon. Please correlate for location of the patient's neoplasm. There is some air along the wall of the bowel in this location on axial image 56 which could be a small diverticulum rather than a small focus of free air but there is a differential. No widespread free air. No free fluid. Vascular/Lymphatic: Aortic atherosclerosis. No enlarged abdominal or pelvic lymph nodes. Reproductive: Uterus and bilateral adnexa are unremarkable. Other: No free fluid. Musculoskeletal: Streak artifact related to the patient's left hip arthroplasty obscuring the surrounding soft tissues. Scattered degenerative changes of the spine and pelvis. There is some small areas of bony sclerosis such as along the left iliac bone image 88, right pubic bone image 119. Favor these being bone islands. IMPRESSION: Slight wall thickening and stranding along the splenic flexure, proximal descending colon. Please correlate for the location of colonic neoplasm. There is a small bubble of air along the wall of the bowel in this location medially. Favor this being a a diverticula with air rather than a bubble of free air. Please correlate with clinical history and if needed short follow-up. No signs  of obstruction. No free-fluid. No widespread free air. No developing lymph node enlargement. Tiny hepatic cystic focus. No separate space-occupying liver lesion. Several small lung nodules as seen on prior chest CT. Recommend continued surveillance as per prior. Coronary artery calcifications. Please correlate for other coronary risk factors. Critical Value/emergent results were called by telephone at the time of interpretation on 01/03/2024 at 12:13 pm to provider  [facility] , who verbally acknowledged these results. Electronically Signed: By: Adrianna Horde M.D. On: 01/03/2024 12:13

## 2024-01-10 ENCOUNTER — Inpatient Hospital Stay: Attending: Hematology | Admitting: Hematology

## 2024-01-10 ENCOUNTER — Inpatient Hospital Stay

## 2024-01-10 ENCOUNTER — Encounter: Payer: Self-pay | Admitting: Hematology

## 2024-01-10 VITALS — BP 192/81 | HR 50 | Temp 97.9°F | Resp 16 | Ht 67.0 in | Wt 164.0 lb

## 2024-01-10 DIAGNOSIS — K648 Other hemorrhoids: Secondary | ICD-10-CM | POA: Insufficient documentation

## 2024-01-10 DIAGNOSIS — R918 Other nonspecific abnormal finding of lung field: Secondary | ICD-10-CM | POA: Diagnosis not present

## 2024-01-10 DIAGNOSIS — C186 Malignant neoplasm of descending colon: Secondary | ICD-10-CM | POA: Diagnosis not present

## 2024-01-10 DIAGNOSIS — K6389 Other specified diseases of intestine: Secondary | ICD-10-CM

## 2024-01-10 LAB — CBC WITH DIFFERENTIAL/PLATELET
Abs Immature Granulocytes: 0.02 10*3/uL (ref 0.00–0.07)
Basophils Absolute: 0 10*3/uL (ref 0.0–0.1)
Basophils Relative: 1 %
Eosinophils Absolute: 0.1 10*3/uL (ref 0.0–0.5)
Eosinophils Relative: 1 %
HCT: 38.2 % (ref 36.0–46.0)
Hemoglobin: 12.8 g/dL (ref 12.0–15.0)
Immature Granulocytes: 1 %
Lymphocytes Relative: 40 %
Lymphs Abs: 1.5 10*3/uL (ref 0.7–4.0)
MCH: 36 pg — ABNORMAL HIGH (ref 26.0–34.0)
MCHC: 33.5 g/dL (ref 30.0–36.0)
MCV: 107.3 fL — ABNORMAL HIGH (ref 80.0–100.0)
Monocytes Absolute: 0.3 10*3/uL (ref 0.1–1.0)
Monocytes Relative: 8 %
Neutro Abs: 1.9 10*3/uL (ref 1.7–7.7)
Neutrophils Relative %: 49 %
Platelets: 191 10*3/uL (ref 150–400)
RBC: 3.56 MIL/uL — ABNORMAL LOW (ref 3.87–5.11)
RDW: 14.8 % (ref 11.5–15.5)
WBC: 3.9 10*3/uL — ABNORMAL LOW (ref 4.0–10.5)
nRBC: 0 % (ref 0.0–0.2)

## 2024-01-10 LAB — IRON AND TIBC
Iron: 123 ug/dL (ref 28–170)
Saturation Ratios: 46 % — ABNORMAL HIGH (ref 10.4–31.8)
TIBC: 267 ug/dL (ref 250–450)
UIBC: 144 ug/dL

## 2024-01-10 LAB — COMPREHENSIVE METABOLIC PANEL WITH GFR
ALT: 13 U/L (ref 0–44)
AST: 16 U/L (ref 15–41)
Albumin: 3.7 g/dL (ref 3.5–5.0)
Alkaline Phosphatase: 51 U/L (ref 38–126)
Anion gap: 9 (ref 5–15)
BUN: 11 mg/dL (ref 8–23)
CO2: 25 mmol/L (ref 22–32)
Calcium: 9.4 mg/dL (ref 8.9–10.3)
Chloride: 107 mmol/L (ref 98–111)
Creatinine, Ser: 0.66 mg/dL (ref 0.44–1.00)
GFR, Estimated: >60 mL/min (ref >60)
Glucose, Bld: 105 mg/dL — ABNORMAL HIGH (ref 70–99)
Potassium: 3.6 mmol/L (ref 3.5–5.1)
Sodium: 141 mmol/L (ref 135–145)
Total Bilirubin: 0.8 mg/dL (ref 0.0–1.2)
Total Protein: 6.7 g/dL (ref 6.5–8.1)

## 2024-01-10 LAB — FERRITIN: Ferritin: 162 ng/mL (ref 11–307)

## 2024-01-10 NOTE — Patient Instructions (Addendum)
 You were seen and examined today by Dr. Cheree Cords. Dr. Katragadda is a medical oncologist, meaning that he specializes in the treatment of cancer diagnoses. Dr. Cheree Cords discussed your past medical history, family history of cancers, and the events that led to you being here today.  We will obtain blood work today.   We will see you back 3 weeks after surgery.

## 2024-01-11 LAB — CEA: CEA: 1.9 ng/mL (ref 0.0–4.7)

## 2024-01-14 ENCOUNTER — Ambulatory Visit: Payer: Self-pay | Admitting: Surgery

## 2024-01-14 DIAGNOSIS — Z01818 Encounter for other preprocedural examination: Secondary | ICD-10-CM

## 2024-01-14 DIAGNOSIS — C186 Malignant neoplasm of descending colon: Secondary | ICD-10-CM | POA: Diagnosis not present

## 2024-01-16 NOTE — Progress Notes (Addendum)
 PCP - Dr. Sims Duck, MD    [facility] Cardiologist - no  PPM/ICD -  Device Orders -  Rep Notified -   Chest x-ray - CT chest 12-28-23 epic EKG - 01-23-24 Stress Test -  ECHO -  Cardiac Cath -   Sleep Study -  CPAP -   Fasting Blood Sugar -  Checks Blood Sugar __0___ times a day  Blood Thinner Instructions:n/a Aspirin Instructions:n/a  ERAS Protcol - PRE-SURGERY Ensure    COVID vaccine -yes  Activity--Able to climb a flight of stairs with no CP or SOB  Anesthesia review: HTN, COPD  Patient denies shortness of breath, fever, cough and chest pain at PAT appointment   All instructions explained to the patient, with a verbal understanding of the material. Patient agrees to go over the instructions while at home for a better understanding. Patient also instructed to self quarantine after being tested for COVID-19. The opportunity to ask questions was provided.

## 2024-01-16 NOTE — Patient Instructions (Addendum)
 SURGICAL WAITING ROOM VISITATION  Patients having surgery or a procedure may have no more than 2 support people in the waiting area - these visitors may rotate.    Children under the age of 68 must have an adult with them who is not the patient.   Visitors with respiratory illnesses are discouraged from visiting and should remain at home.  If the patient needs to stay at the hospital during part of their recovery, the visitor guidelines for inpatient rooms apply. Pre-op nurse will coordinate an appropriate time for 1 support person to accompany patient in pre-op.  This support person may not rotate.    Please refer to the [facility] website for the visitor guidelines for Inpatients (after your surgery is over and you are in a regular room).       Your procedure is scheduled on: 02-01-24    Report to [facility] Main Entrance    Report to admitting at       0515  AM   Call this number if you have problems the morning of surgery [phone]   FOLLOW A CLEAR LIQUIDS DIET THE DAY OF YOUR BOWEL PREP TO PREVENT DEHYDRATION              FOLLOW BOWEL PREP PER MD OFFICE    You may have the following liquids until _0430 _____ AM/ DAY OF SURGERY  THEN NOTHING BY MOUTH  Water Non-Citrus Juices (without pulp, NO RED-Apple, White grape, White cranberry) Black Coffee (NO MILK/CREAM OR CREAMERS, sugar ok)  Clear Tea (NO MILK/CREAM OR CREAMERS, sugar ok) regular and decaf                             Plain Jell-O (NO RED)                                           Fruit ices (not with fruit pulp, NO RED)                                     Popsicles (NO RED)                                                               Sports drinks like Gatorade (NO RED)              Drink 2 Ensure/G2 drinks AT 10:00 PM the night before surgery.        The day of surgery:  Drink ONE (1) Pre-Surgery Clear Ensure BY   0430  AM the morning of surgery. Drink in one sitting. Do not sip.  This drink was  given to you during your hospital  pre-op appointment visit. Nothing else to drink after completing the  Pre-Surgery Clear Ensure .          If you have questions, please contact your surgeon's office.   FOLLOW BOWEL PREP AND ANY ADDITIONAL PRE OP INSTRUCTIONS YOU RECEIVED FROM YOUR SURGEON'S OFFICE!!!     Oral Hygiene is also important to reduce your risk of infection.  Remember - BRUSH YOUR TEETH THE MORNING OF SURGERY WITH YOUR REGULAR TOOTHPASTE  DENTURES WILL BE REMOVED PRIOR TO SURGERY PLEASE DO NOT APPLY "Poly grip" OR ADHESIVES!!!   Do NOT smoke after Midnight   Stop all vitamins and herbal supplements 7 days before surgery.   Take these medicines the morning of surgery with A SIP OF WATER: Gabapentin , Inhaler and  bring rescue inhaler with you                                  You may not have any metal on your body including hair pins, jewelry, and body piercing             Do not wear make-up, lotions, powders, perfumes/cologne, or deodorant  Do not wear nail polish including gel and S&S, artificial/acrylic nails, or any other type of covering on natural nails including finger and toenails. If you have artificial nails, gel coating, etc. that needs to be removed by a nail salon please have this removed prior to surgery or surgery may need to be canceled/ delayed if the surgeon/ anesthesia feels like they are unable to be safely monitored.   Do not shave  48 hours prior to surgery.           Do not bring valuables to the hospital. [place] IS NOT             RESPONSIBLE   FOR VALUABLES.   Contacts, glasses, dentures or bridgework may not be worn into surgery.   Bring small overnight bag day of surgery.   DO NOT BRING YOUR HOME MEDICATIONS TO THE HOSPITAL. PHARMACY WILL DISPENSE MEDICATIONS LISTED ON YOUR MEDICATION LIST TO YOU DURING YOUR ADMISSION IN THE HOSPITAL!    Patients discharged on the day of surgery will not be  allowed to drive home.  Someone NEEDS to stay with you for the first 24 hours after anesthesia.   Special Instructions: Bring a copy of your healthcare power of attorney and living will documents the day of surgery if you haven't scanned them before.              Please read over the following fact sheets you were given: IF YOU HAVE QUESTIONS ABOUT YOUR PRE-OP INSTRUCTIONS PLEASE CALL [phone]   If you test positive for Covid or have been in contact with anyone that has tested positive in the last 10 days please notify you surgeon.     - Preparing for Surgery Before surgery, you can play an important role.  Because skin is not sterile, your skin needs to be as free of germs as possible.  You can reduce the number of germs on your skin by washing with CHG (chlorahexidine gluconate) soap before surgery.  CHG is an antiseptic cleaner which kills germs and bonds with the skin to continue killing germs even after washing. Please DO NOT use if you have an allergy to CHG or antibacterial soaps.  If your skin becomes reddened/irritated stop using the CHG and inform your nurse when you arrive at Short Stay. Do not shave (including legs and underarms) for at least 48 hours prior to the first CHG shower.  You may shave your face/neck. Please follow these instructions carefully:  1.  Shower with CHG Soap the night before surgery and the  morning of Surgery.  2.  If you choose to wash your hair, wash your hair first  as usual with your  normal  shampoo.  3.  After you shampoo, rinse your hair and body thoroughly to remove the  shampoo.                           4.  Use CHG as you would any other liquid soap.  You can apply chg directly  to the skin and wash                       Gently with a scrungie or clean washcloth.  5.  Apply the CHG Soap to your body ONLY FROM THE NECK DOWN.   Do not use on face/ open                           Wound or open sores. Avoid contact with eyes, ears mouth and  genitals (private parts).                       Wash face,  Genitals (private parts) with your normal soap.             6.  Wash thoroughly, paying special attention to the area where your surgery  will be performed.  7.  Thoroughly rinse your body with warm water from the neck down.  8.  DO NOT shower/wash with your normal soap after using and rinsing off  the CHG Soap.                9.  Pat yourself dry with a clean towel.            10.  Wear clean pajamas.            11.  Place clean sheets on your bed the night of your first shower and do not  sleep with pets. Day of Surgery : Do not apply any lotions/deodorants the morning of surgery.  Please wear clean clothes to the hospital/surgery center.  FAILURE TO FOLLOW THESE INSTRUCTIONS MAY RESULT IN THE CANCELLATION OF YOUR SURGERY PATIENT SIGNATURE_________________________________  NURSE SIGNATURE__________________________________  ________________________________________________________________________ WHAT IS A BLOOD TRANSFUSION? Blood Transfusion Information  A transfusion is the replacement of blood or some of its parts. Blood is made up of multiple cells which provide different functions. Red blood cells carry oxygen and are used for blood loss replacement. White blood cells fight against infection. Platelets control bleeding. Plasma helps clot blood. Other blood products are available for specialized needs, such as hemophilia or other clotting disorders. BEFORE THE TRANSFUSION  Who gives blood for transfusions?  Healthy volunteers who are fully evaluated to make sure their blood is safe. This is blood bank blood. Transfusion therapy is the safest it has ever been in the practice of medicine. Before blood is taken from a donor, a complete history is taken to make sure that person has no history of diseases nor engages in risky social behavior (examples are intravenous drug use or sexual activity with multiple partners). The donor's  travel history is screened to minimize risk of transmitting infections, such as malaria. The donated blood is tested for signs of infectious diseases, such as HIV and hepatitis. The blood is then tested to be sure it is compatible with you in order to minimize the chance of a transfusion reaction. If you or a relative donates blood, this is often done in anticipation of surgery and is  not appropriate for emergency situations. It takes many days to process the donated blood. RISKS AND COMPLICATIONS Although transfusion therapy is very safe and saves many lives, the main dangers of transfusion include:  Getting an infectious disease. Developing a transfusion reaction. This is an allergic reaction to something in the blood you were given. Every precaution is taken to prevent this. The decision to have a blood transfusion has been considered carefully by your caregiver before blood is given. Blood is not given unless the benefits outweigh the risks. AFTER THE TRANSFUSION Right after receiving a blood transfusion, you will usually feel much better and more energetic. This is especially true if your red blood cells have gotten low (anemic). The transfusion raises the level of the red blood cells which carry oxygen, and this usually causes an energy increase. The nurse administering the transfusion will monitor you carefully for complications. HOME CARE INSTRUCTIONS  No special instructions are needed after a transfusion. You may find your energy is better. Speak with your caregiver about any limitations on activity for underlying diseases you may have. SEEK MEDICAL CARE IF:  Your condition is not improving after your transfusion. You develop redness or irritation at the intravenous (IV) site. SEEK IMMEDIATE MEDICAL CARE IF:  Any of the following symptoms occur over the next 12 hours: Shaking chills. You have a temperature by mouth above 102 F (38.9 C), not controlled by medicine. Chest, back, or muscle  pain. People around you feel you are not acting correctly or are confused. Shortness of breath or difficulty breathing. Dizziness and fainting. You get a rash or develop hives. You have a decrease in urine output. Your urine turns a dark color or changes to pink, red, or brown. Any of the following symptoms occur over the next 10 days: You have a temperature by mouth above 102 F (38.9 C), not controlled by medicine. Shortness of breath. Weakness after normal activity. The white part of the eye turns yellow (jaundice). You have a decrease in the amount of urine or are urinating less often. Your urine turns a dark color or changes to pink, red, or brown. Document Released: 08/25/2000 Document Revised: 11/20/2011 Document Reviewed: 04/13/2008 Exit[employer] Patient Information 2014 [place], [place].  _______________________________________________________________________  Incentive Spirometer  An incentive spirometer is a tool that can help keep your lungs clear and active. This tool measures how well you are filling your lungs with each breath. Taking long deep breaths may help reverse or decrease the chance of developing breathing (pulmonary) problems (especially infection) following: A long period of time when you are unable to move or be active. BEFORE THE PROCEDURE  If the spirometer includes an indicator to show your best effort, your nurse or respiratory therapist will set it to a desired goal. If possible, sit up straight or lean slightly forward. Try not to slouch. Hold the incentive spirometer in an upright position. INSTRUCTIONS FOR USE  Sit on the edge of your bed if possible, or sit up as far as you can in bed or on a chair. Hold the incentive spirometer in an upright position. Breathe out normally. Place the mouthpiece in your mouth and seal your lips tightly around it. Breathe in slowly and as deeply as possible, raising the piston or the ball toward the top of the  column. Hold your breath for 3-5 seconds or for as long as possible. Allow the piston or ball to fall to the bottom of the column. Remove the mouthpiece from your mouth and  breathe out normally. Rest for a few seconds and repeat Steps 1 through 7 at least 10 times every 1-2 hours when you are awake. Take your time and take a few normal breaths between deep breaths. The spirometer may include an indicator to show your best effort. Use the indicator as a goal to work toward during each repetition. After each set of 10 deep breaths, practice coughing to be sure your lungs are clear. If you have an incision (the cut made at the time of surgery), support your incision when coughing by placing a pillow or rolled up towels firmly against it. Once you are able to get out of bed, walk around indoors and cough well. You may stop using the incentive spirometer when instructed by your caregiver.  RISKS AND COMPLICATIONS Take your time so you do not get dizzy or light-headed. If you are in pain, you may need to take or ask for pain medication before doing incentive spirometry. It is harder to take a deep breath if you are having pain. AFTER USE Rest and breathe slowly and easily. It can be helpful to keep track of a log of your progress. Your caregiver can provide you with a simple table to help with this. If you are using the spirometer at home, follow these instructions: SEEK MEDICAL CARE IF:  You are having difficultly using the spirometer. You have trouble using the spirometer as often as instructed. Your pain medication is not giving enough relief while using the spirometer. You develop fever of 100.5 F (38.1 C) or higher. SEEK IMMEDIATE MEDICAL CARE IF:  You cough up bloody sputum that had not been present before. You develop fever of 102 F (38.9 C) or greater. You develop worsening pain at or near the incision site. MAKE SURE YOU:  Understand these instructions. Will watch your  condition. Will get help right away if you are not doing well or get worse. Document Released: 01/08/2007 Document Revised: 11/20/2011 Document Reviewed: 03/11/2007 [facility] Patient Information 2014 [place], [place].   ________________________________________________________________________

## 2024-01-17 ENCOUNTER — Ambulatory Visit (HOSPITAL_COMMUNITY)
Admission: RE | Admit: 2024-01-17 | Discharge: 2024-01-17 | Disposition: A | Discharge status: Home / Self Care | Source: Ambulatory Visit | Attending: Internal Medicine | Admitting: Internal Medicine

## 2024-01-17 DIAGNOSIS — E041 Nontoxic single thyroid nodule: Secondary | ICD-10-CM | POA: Diagnosis not present

## 2024-01-23 ENCOUNTER — Other Ambulatory Visit: Payer: Self-pay

## 2024-01-23 ENCOUNTER — Encounter (HOSPITAL_COMMUNITY)
Admission: RE | Admit: 2024-01-23 | Discharge: 2024-01-23 | Disposition: A | Discharge status: Home / Self Care | Source: Ambulatory Visit | Attending: Surgery | Admitting: Surgery

## 2024-01-23 ENCOUNTER — Encounter (HOSPITAL_COMMUNITY): Payer: Self-pay

## 2024-01-23 VITALS — BP 165/73 | HR 50 | Temp 98.7°F | Resp 16 | Ht 65.0 in | Wt 167.0 lb

## 2024-01-23 DIAGNOSIS — Z01818 Encounter for other preprocedural examination: Secondary | ICD-10-CM | POA: Diagnosis not present

## 2024-01-23 DIAGNOSIS — I10 Essential (primary) hypertension: Secondary | ICD-10-CM | POA: Insufficient documentation

## 2024-01-23 HISTORY — DX: Malignant (primary) neoplasm, unspecified: C80.1

## 2024-01-31 NOTE — Anesthesia Preprocedure Evaluation (Addendum)
 Anesthesia Evaluation  Patient identified by MRN, date of birth, ID band Patient awake    Reviewed: Allergy & Precautions, H&P , NPO status , Patient's Chart, lab work & pertinent test results, reviewed documented beta blocker date and time   Airway Mallampati: II  TM Distance: >3 FB Neck ROM: full    Dental  (+) Edentulous Upper,    Pulmonary COPD, Patient abstained from smoking., former smoker   Pulmonary exam normal breath sounds clear to auscultation       Cardiovascular Exercise Tolerance: Good hypertension,  Rhythm:regular Rate:Normal     Neuro/Psych negative neurological ROS  negative psych ROS   GI/Hepatic Neg liver ROS,,,Malignant neoplasm of descending colon   Endo/Other  negative endocrine ROS    Renal/GU negative Renal ROS  negative genitourinary   Musculoskeletal   Abdominal   Peds  Hematology negative hematology ROS (+)   Anesthesia Other Findings   Reproductive/Obstetrics negative OB ROS                             Anesthesia Physical Anesthesia Plan  ASA: 2  Anesthesia Plan: General   Post-op Pain Management: Tylenol  PO (pre-op)* and Celebrex  PO (pre-op)*   Induction: Intravenous  PONV Risk Score and Plan: 3 and Dexamethasone , Midazolam  and Ondansetron   Airway Management Planned: Oral ETT  Additional Equipment: None  Intra-op Plan:   Post-operative Plan: Extubation in OR  Informed Consent: I have reviewed the patients History and Physical, chart, labs and discussed the procedure including the risks, benefits and alternatives for the proposed anesthesia with the patient or authorized representative who has indicated his/her understanding and acceptance.     Dental Advisory Given  Plan Discussed with: CRNA  Anesthesia Plan Comments:        Anesthesia Quick Evaluation

## 2024-02-01 ENCOUNTER — Encounter (HOSPITAL_COMMUNITY)
Admission: RE | Disposition: A | Discharge status: Home / Self Care | Payer: Self-pay | Source: Home / Self Care | Attending: Surgery

## 2024-02-01 ENCOUNTER — Inpatient Hospital Stay (HOSPITAL_COMMUNITY)
Admission: RE | Admit: 2024-02-01 | Discharge: 2024-02-03 | DRG: 331 | Disposition: A | Discharge status: Home / Self Care | Attending: Surgery | Admitting: Surgery

## 2024-02-01 ENCOUNTER — Other Ambulatory Visit: Payer: Self-pay

## 2024-02-01 ENCOUNTER — Inpatient Hospital Stay (HOSPITAL_COMMUNITY)

## 2024-02-01 ENCOUNTER — Encounter (HOSPITAL_COMMUNITY): Payer: Self-pay | Admitting: Surgery

## 2024-02-01 DIAGNOSIS — J449 Chronic obstructive pulmonary disease, unspecified: Secondary | ICD-10-CM | POA: Diagnosis present

## 2024-02-01 DIAGNOSIS — Z8249 Family history of ischemic heart disease and other diseases of the circulatory system: Secondary | ICD-10-CM | POA: Diagnosis not present

## 2024-02-01 DIAGNOSIS — C185 Malignant neoplasm of splenic flexure: Secondary | ICD-10-CM | POA: Diagnosis not present

## 2024-02-01 DIAGNOSIS — C186 Malignant neoplasm of descending colon: Principal | ICD-10-CM | POA: Diagnosis present

## 2024-02-01 DIAGNOSIS — I2510 Atherosclerotic heart disease of native coronary artery without angina pectoris: Secondary | ICD-10-CM | POA: Diagnosis present

## 2024-02-01 DIAGNOSIS — D123 Benign neoplasm of transverse colon: Secondary | ICD-10-CM | POA: Diagnosis not present

## 2024-02-01 DIAGNOSIS — K5989 Other specified functional intestinal disorders: Secondary | ICD-10-CM

## 2024-02-01 DIAGNOSIS — Z9049 Acquired absence of other specified parts of digestive tract: Principal | ICD-10-CM

## 2024-02-01 DIAGNOSIS — I10 Essential (primary) hypertension: Secondary | ICD-10-CM | POA: Diagnosis present

## 2024-02-01 DIAGNOSIS — F17210 Nicotine dependence, cigarettes, uncomplicated: Secondary | ICD-10-CM | POA: Diagnosis present

## 2024-02-01 LAB — ABO/RH: ABO/RH(D): A POS

## 2024-02-01 LAB — TYPE AND SCREEN
ABO/RH(D): A POS
Antibody Screen: NEGATIVE

## 2024-02-01 SURGERY — COLECTOMY, PARTIAL, ROBOT-ASSISTED, LAPAROSCOPIC
Anesthesia: General

## 2024-02-01 MED ORDER — ROCURONIUM BROMIDE 100 MG/10ML IV SOLN
INTRAVENOUS | Status: DC | PRN
  Administered 2024-02-01: 20 mg via INTRAVENOUS
  Administered 2024-02-01: 60 mg via INTRAVENOUS

## 2024-02-01 MED ORDER — IBUPROFEN 400 MG PO TABS
600.0000 mg | ORAL_TABLET | Freq: Four times a day (QID) | ORAL | Status: DC | PRN
  Administered 2024-02-01: 600 mg via ORAL
  Filled 2024-02-01: qty 1

## 2024-02-01 MED ORDER — HYDROMORPHONE HCL 1 MG/ML IJ SOLN
INTRAMUSCULAR | Status: DC | PRN
  Administered 2024-02-01 (×2): .5 mg via INTRAVENOUS
  Administered 2024-02-01: 1 mg via INTRAVENOUS

## 2024-02-01 MED ORDER — HYDRALAZINE HCL 20 MG/ML IJ SOLN: 10.0000 mg | INTRAMUSCULAR | Status: DC | PRN

## 2024-02-01 MED ORDER — ALBUMIN HUMAN 5 % IV SOLN
INTRAVENOUS | Status: AC
  Filled 2024-02-01: qty 250

## 2024-02-01 MED ORDER — KETAMINE HCL 50 MG/5ML IJ SOSY
PREFILLED_SYRINGE | INTRAMUSCULAR | Status: DC | PRN
  Administered 2024-02-01: 20 mg via INTRAVENOUS

## 2024-02-01 MED ORDER — DEXAMETHASONE SODIUM PHOSPHATE 10 MG/ML IJ SOLN
INTRAMUSCULAR | Status: DC | PRN
  Administered 2024-02-01: 10 mg via INTRAVENOUS

## 2024-02-01 MED ORDER — CHLORHEXIDINE GLUCONATE CLOTH 2 % EX PADS: 6.0000 | MEDICATED_PAD | Freq: Once | CUTANEOUS | Status: DC

## 2024-02-01 MED ORDER — PROPOFOL 500 MG/50ML IV EMUL
INTRAVENOUS | Status: DC | PRN
Start: 2024-02-01 — End: 2024-02-01
  Administered 2024-02-01: 75 ug/kg/min via INTRAVENOUS

## 2024-02-01 MED ORDER — FENTANYL CITRATE PF 50 MCG/ML IJ SOSY
PREFILLED_SYRINGE | INTRAMUSCULAR | Status: AC
  Filled 2024-02-01: qty 1

## 2024-02-01 MED ORDER — ACETAMINOPHEN 500 MG PO TABS
1000.0000 mg | ORAL_TABLET | Freq: Four times a day (QID) | ORAL | Status: DC
  Administered 2024-02-01 – 2024-02-03 (×8): 1000 mg via ORAL
  Filled 2024-02-01 (×7): qty 2

## 2024-02-01 MED ORDER — NEOMYCIN SULFATE 500 MG PO TABS: 1000.0000 mg | ORAL_TABLET | ORAL | Status: DC

## 2024-02-01 MED ORDER — PROPOFOL 1000 MG/100ML IV EMUL
INTRAVENOUS | Status: AC
  Filled 2024-02-01: qty 100

## 2024-02-01 MED ORDER — HEPARIN SODIUM (PORCINE) 5000 UNIT/ML IJ SOLN
5000.0000 [IU] | Freq: Once | INTRAMUSCULAR | Status: AC
  Administered 2024-02-01: 5000 [IU] via SUBCUTANEOUS
  Filled 2024-02-01: qty 1

## 2024-02-01 MED ORDER — OXYCODONE HCL 5 MG PO TABS
5.0000 mg | ORAL_TABLET | Freq: Once | ORAL | Status: AC | PRN
  Administered 2024-02-01: 5 mg via ORAL

## 2024-02-01 MED ORDER — DIPHENHYDRAMINE HCL 12.5 MG/5ML PO ELIX: 12.5000 mg | ORAL_SOLUTION | Freq: Four times a day (QID) | ORAL | Status: DC | PRN

## 2024-02-01 MED ORDER — FLUTICASONE FUROATE-VILANTEROL 100-25 MCG/ACT IN AEPB: 1.0000 | INHALATION_SPRAY | Freq: Every day | RESPIRATORY_TRACT | Status: DC | PRN

## 2024-02-01 MED ORDER — HYDRALAZINE HCL 25 MG PO TABS
25.0000 mg | ORAL_TABLET | Freq: Two times a day (BID) | ORAL | Status: DC
  Administered 2024-02-01 – 2024-02-03 (×5): 25 mg via ORAL
  Filled 2024-02-01 (×5): qty 1

## 2024-02-01 MED ORDER — BUPIVACAINE LIPOSOME 1.3 % IJ SUSP: 20.0000 mL | Freq: Once | INTRAMUSCULAR | Status: DC

## 2024-02-01 MED ORDER — TRAMADOL HCL 50 MG PO TABS: 50.0000 mg | ORAL_TABLET | Freq: Four times a day (QID) | ORAL | 0 refills | Status: AC | PRN

## 2024-02-01 MED ORDER — MIDAZOLAM HCL 5 MG/5ML IJ SOLN
INTRAMUSCULAR | Status: DC | PRN
  Administered 2024-02-01: 2 mg via INTRAVENOUS

## 2024-02-01 MED ORDER — GABAPENTIN 100 MG PO CAPS
100.0000 mg | ORAL_CAPSULE | Freq: Three times a day (TID) | ORAL | Status: DC | PRN
  Administered 2024-02-01: 100 mg via ORAL
  Filled 2024-02-01 (×2): qty 1

## 2024-02-01 MED ORDER — 0.9 % SODIUM CHLORIDE (POUR BTL) OPTIME
TOPICAL | Status: DC | PRN
  Administered 2024-02-01: 1000 mL

## 2024-02-01 MED ORDER — LACTATED RINGERS IV SOLN: INTRAVENOUS | Status: DC

## 2024-02-01 MED ORDER — BISACODYL 5 MG PO TBEC
20.0000 mg | DELAYED_RELEASE_TABLET | Freq: Once | ORAL | Status: DC
Start: 2024-02-01 — End: 2024-02-01

## 2024-02-01 MED ORDER — OXYCODONE HCL 5 MG PO TABS
ORAL_TABLET | ORAL | Status: AC
  Filled 2024-02-01: qty 1

## 2024-02-01 MED ORDER — ALBUMIN HUMAN 5 % IV SOLN: INTRAVENOUS | Status: DC | PRN

## 2024-02-01 MED ORDER — PROPOFOL 10 MG/ML IV BOLUS
INTRAVENOUS | Status: DC | PRN
Start: 2024-02-01 — End: 2024-02-01
  Administered 2024-02-01: 100 mg via INTRAVENOUS

## 2024-02-01 MED ORDER — PROPOFOL 10 MG/ML IV BOLUS
INTRAVENOUS | Status: AC
  Filled 2024-02-01: qty 20

## 2024-02-01 MED ORDER — NICOTINE POLACRILEX 2 MG MT GUM
2.0000 mg | CHEWING_GUM | OROMUCOSAL | Status: DC | PRN
  Filled 2024-02-01 (×4): qty 1

## 2024-02-01 MED ORDER — FLUTICASONE FUROATE-VILANTEROL 100-25 MCG/ACT IN AEPB
1.0000 | INHALATION_SPRAY | Freq: Every day | RESPIRATORY_TRACT | Status: DC | PRN
Start: 2024-02-01 — End: 2024-02-03
  Filled 2024-02-01: qty 28

## 2024-02-01 MED ORDER — SIMETHICONE 80 MG PO CHEW: 40.0000 mg | CHEWABLE_TABLET | Freq: Four times a day (QID) | ORAL | Status: DC | PRN

## 2024-02-01 MED ORDER — CELECOXIB 200 MG PO CAPS: 200.0000 mg | ORAL_CAPSULE | Freq: Once | ORAL | Status: AC

## 2024-02-01 MED ORDER — LIDOCAINE HCL (PF) 2 % IJ SOLN
INTRAMUSCULAR | Status: AC
  Filled 2024-02-01: qty 5

## 2024-02-01 MED ORDER — ENSURE PRE-SURGERY PO LIQD
592.0000 mL | Freq: Once | ORAL | Status: DC
  Filled 2024-02-01: qty 592

## 2024-02-01 MED ORDER — ORAL CARE MOUTH RINSE: 15.0000 mL | Freq: Once | OROMUCOSAL | Status: AC

## 2024-02-01 MED ORDER — BUPIVACAINE LIPOSOME 1.3 % IJ SUSP
INTRAMUSCULAR | Status: AC
  Filled 2024-02-01: qty 20

## 2024-02-01 MED ORDER — FENTANYL CITRATE (PF) 100 MCG/2ML IJ SOLN
INTRAMUSCULAR | Status: AC
  Filled 2024-02-01: qty 2

## 2024-02-01 MED ORDER — LIDOCAINE HCL (CARDIAC) PF 100 MG/5ML IV SOSY
PREFILLED_SYRINGE | INTRAVENOUS | Status: DC | PRN
  Administered 2024-02-01: 80 mg via INTRAVENOUS

## 2024-02-01 MED ORDER — DROPERIDOL 2.5 MG/ML IJ SOLN: 0.6250 mg | Freq: Once | INTRAMUSCULAR | Status: DC | PRN

## 2024-02-01 MED ORDER — ONDANSETRON HCL 4 MG/2ML IJ SOLN: 4.0000 mg | Freq: Four times a day (QID) | INTRAMUSCULAR | Status: DC | PRN

## 2024-02-01 MED ORDER — ACETAMINOPHEN 500 MG PO TABS
ORAL_TABLET | ORAL | Status: AC
  Administered 2024-02-01: 1000 mg via ORAL
  Filled 2024-02-01: qty 2

## 2024-02-01 MED ORDER — HEPARIN SODIUM (PORCINE) 5000 UNIT/ML IJ SOLN
5000.0000 [IU] | Freq: Three times a day (TID) | INTRAMUSCULAR | Status: DC
  Administered 2024-02-01 – 2024-02-03 (×6): 5000 [IU] via SUBCUTANEOUS
  Filled 2024-02-01 (×6): qty 1

## 2024-02-01 MED ORDER — SODIUM CHLORIDE 0.9 % IV SOLN
2.0000 g | INTRAVENOUS | Status: AC
  Administered 2024-02-01: 2 g via INTRAVENOUS
  Filled 2024-02-01: qty 2

## 2024-02-01 MED ORDER — DEXMEDETOMIDINE HCL IN NACL 80 MCG/20ML IV SOLN
INTRAVENOUS | Status: DC | PRN
  Administered 2024-02-01: 4 ug via INTRAVENOUS
  Administered 2024-02-01: 8 ug via INTRAVENOUS

## 2024-02-01 MED ORDER — ACETAMINOPHEN 500 MG PO TABS: 1000.0000 mg | ORAL_TABLET | ORAL | Status: AC

## 2024-02-01 MED ORDER — BUPIVACAINE-EPINEPHRINE (PF) 0.25% -1:200000 IJ SOLN
INTRAMUSCULAR | Status: DC | PRN
  Administered 2024-02-01: 50 mL

## 2024-02-01 MED ORDER — SODIUM BICARBONATE 650 MG PO TABS
650.0000 mg | ORAL_TABLET | Freq: Every day | ORAL | Status: DC
  Administered 2024-02-01 – 2024-02-03 (×3): 650 mg via ORAL
  Filled 2024-02-01 (×3): qty 1

## 2024-02-01 MED ORDER — EPHEDRINE SULFATE-NACL 50-0.9 MG/10ML-% IV SOSY
PREFILLED_SYRINGE | INTRAVENOUS | Status: DC | PRN
Start: 2024-02-01 — End: 2024-02-01
  Administered 2024-02-01: 5 mg via INTRAVENOUS

## 2024-02-01 MED ORDER — METRONIDAZOLE 500 MG PO TABS
1000.0000 mg | ORAL_TABLET | ORAL | Status: DC
Start: 2024-02-01 — End: 2024-02-01

## 2024-02-01 MED ORDER — IRBESARTAN 150 MG PO TABS
300.0000 mg | ORAL_TABLET | Freq: Every day | ORAL | Status: DC
  Administered 2024-02-02 – 2024-02-03 (×2): 300 mg via ORAL
  Filled 2024-02-01 (×2): qty 2

## 2024-02-01 MED ORDER — ONDANSETRON HCL 4 MG/2ML IJ SOLN
INTRAMUSCULAR | Status: DC | PRN
  Administered 2024-02-01: 4 mg via INTRAVENOUS

## 2024-02-01 MED ORDER — KETAMINE HCL 50 MG/5ML IJ SOSY
PREFILLED_SYRINGE | INTRAMUSCULAR | Status: AC
  Filled 2024-02-01: qty 5

## 2024-02-01 MED ORDER — CELECOXIB 200 MG PO CAPS
ORAL_CAPSULE | ORAL | Status: AC
  Administered 2024-02-01: 200 mg via ORAL
  Filled 2024-02-01: qty 1

## 2024-02-01 MED ORDER — ACETAMINOPHEN 10 MG/ML IV SOLN: 1000.0000 mg | Freq: Once | INTRAVENOUS | Status: DC | PRN

## 2024-02-01 MED ORDER — FENTANYL CITRATE PF 50 MCG/ML IJ SOSY
PREFILLED_SYRINGE | INTRAMUSCULAR | Status: AC
Start: 2024-02-01 — End: ?
  Filled 2024-02-01: qty 1

## 2024-02-01 MED ORDER — SUGAMMADEX SODIUM 200 MG/2ML IV SOLN
INTRAVENOUS | Status: DC | PRN
  Administered 2024-02-01: 200 mg via INTRAVENOUS

## 2024-02-01 MED ORDER — EPHEDRINE 5 MG/ML INJ
INTRAVENOUS | Status: AC
  Filled 2024-02-01: qty 5

## 2024-02-01 MED ORDER — TRAMADOL HCL 50 MG PO TABS
50.0000 mg | ORAL_TABLET | Freq: Four times a day (QID) | ORAL | Status: DC | PRN
  Administered 2024-02-01 – 2024-02-03 (×4): 50 mg via ORAL
  Filled 2024-02-01 (×4): qty 1

## 2024-02-01 MED ORDER — HYDROMORPHONE HCL 2 MG/ML IJ SOLN
INTRAMUSCULAR | Status: AC
  Filled 2024-02-01: qty 1

## 2024-02-01 MED ORDER — CHLORHEXIDINE GLUCONATE 0.12 % MT SOLN
15.0000 mL | Freq: Once | OROMUCOSAL | Status: AC
  Administered 2024-02-01: 15 mL via OROMUCOSAL

## 2024-02-01 MED ORDER — DIPHENHYDRAMINE HCL 50 MG/ML IJ SOLN: 12.5000 mg | Freq: Four times a day (QID) | INTRAMUSCULAR | Status: DC | PRN

## 2024-02-01 MED ORDER — ACETAMINOPHEN 500 MG PO TABS: 1000.0000 mg | ORAL_TABLET | Freq: Once | ORAL | Status: AC

## 2024-02-01 MED ORDER — OXYCODONE HCL 5 MG/5ML PO SOLN: 5.0000 mg | Freq: Once | ORAL | Status: AC | PRN

## 2024-02-01 MED ORDER — ROCURONIUM BROMIDE 10 MG/ML (PF) SYRINGE
PREFILLED_SYRINGE | INTRAVENOUS | Status: AC
  Filled 2024-02-01: qty 10

## 2024-02-01 MED ORDER — ENSURE SURGERY PO LIQD
237.0000 mL | Freq: Two times a day (BID) | ORAL | Status: DC
  Administered 2024-02-01: 237 mL via ORAL

## 2024-02-01 MED ORDER — DEXAMETHASONE SODIUM PHOSPHATE 10 MG/ML IJ SOLN
INTRAMUSCULAR | Status: AC
  Filled 2024-02-01: qty 1

## 2024-02-01 MED ORDER — FENTANYL CITRATE PF 50 MCG/ML IJ SOSY
25.0000 ug | PREFILLED_SYRINGE | INTRAMUSCULAR | Status: DC | PRN
Start: 2024-02-01 — End: 2024-02-01
  Administered 2024-02-01: 50 ug via INTRAVENOUS
  Administered 2024-02-01: 25 ug via INTRAVENOUS
  Administered 2024-02-01: 50 ug via INTRAVENOUS

## 2024-02-01 MED ORDER — POLYETHYLENE GLYCOL 3350 17 GM/SCOOP PO POWD: 238.0000 g | Freq: Once | ORAL | Status: DC

## 2024-02-01 MED ORDER — BUPIVACAINE-EPINEPHRINE (PF) 0.25% -1:200000 IJ SOLN
INTRAMUSCULAR | Status: AC
  Filled 2024-02-01: qty 30

## 2024-02-01 MED ORDER — FENTANYL CITRATE (PF) 100 MCG/2ML IJ SOLN
INTRAMUSCULAR | Status: DC | PRN
  Administered 2024-02-01 (×4): 50 ug via INTRAVENOUS

## 2024-02-01 MED ORDER — DEXMEDETOMIDINE HCL IN NACL 80 MCG/20ML IV SOLN
INTRAVENOUS | Status: AC
  Filled 2024-02-01: qty 20

## 2024-02-01 MED ORDER — ENSURE PRE-SURGERY PO LIQD
296.0000 mL | Freq: Once | ORAL | Status: DC
  Filled 2024-02-01: qty 296

## 2024-02-01 MED ORDER — ALVIMOPAN 12 MG PO CAPS
12.0000 mg | ORAL_CAPSULE | ORAL | Status: AC
  Administered 2024-02-01: 12 mg via ORAL
  Filled 2024-02-01: qty 1

## 2024-02-01 MED ORDER — ONDANSETRON HCL 4 MG/2ML IJ SOLN
INTRAMUSCULAR | Status: AC
  Filled 2024-02-01: qty 2

## 2024-02-01 MED ORDER — ONDANSETRON HCL 4 MG PO TABS
4.0000 mg | ORAL_TABLET | Freq: Four times a day (QID) | ORAL | Status: DC | PRN
Start: 2024-02-01 — End: 2024-02-03

## 2024-02-01 MED ORDER — ALBUTEROL SULFATE (2.5 MG/3ML) 0.083% IN NEBU: 2.5000 mg | INHALATION_SOLUTION | Freq: Four times a day (QID) | RESPIRATORY_TRACT | Status: DC | PRN

## 2024-02-01 MED ORDER — STERILE WATER FOR IRRIGATION IR SOLN
Status: DC | PRN
  Administered 2024-02-01: 1000 mL

## 2024-02-01 MED ORDER — ALVIMOPAN 12 MG PO CAPS
12.0000 mg | ORAL_CAPSULE | Freq: Two times a day (BID) | ORAL | Status: DC
  Administered 2024-02-03: 12 mg via ORAL
  Filled 2024-02-01: qty 1

## 2024-02-01 MED ORDER — MIDAZOLAM HCL 2 MG/2ML IJ SOLN
INTRAMUSCULAR | Status: AC
  Filled 2024-02-01: qty 2

## 2024-02-01 MED ORDER — ALUM & MAG HYDROXIDE-SIMETH 200-200-20 MG/5ML PO SUSP: 30.0000 mL | Freq: Four times a day (QID) | ORAL | Status: DC | PRN

## 2024-02-01 MED ORDER — HYDROMORPHONE HCL 1 MG/ML IJ SOLN
0.5000 mg | INTRAMUSCULAR | Status: DC | PRN
  Administered 2024-02-02 (×2): 0.5 mg via INTRAVENOUS
  Filled 2024-02-01 (×2): qty 0.5

## 2024-02-01 SURGICAL SUPPLY — 98 items
BAG COUNTER SPONGE SURGICOUNT (BAG) IMPLANT
BLADE EXTENDED COATED 6.5IN (ELECTRODE) ×1 IMPLANT
CANNULA REDUCER 12-8 DVNC XI (CANNULA) ×1 IMPLANT
CELLS DAT CNTRL 66122 CELL SVR (MISCELLANEOUS) IMPLANT
CHLORAPREP W/TINT 26 (MISCELLANEOUS) ×1 IMPLANT
CLIP APPLIE 5 13 M/L LIGAMAX5 (MISCELLANEOUS) IMPLANT
CLIP APPLIE ROT 10 11.4 M/L (STAPLE) IMPLANT
CLIP LIGATING HEM O LOK PURPLE (MISCELLANEOUS) IMPLANT
CLIP LIGATING HEMO O LOK GREEN (MISCELLANEOUS) IMPLANT
COVER SURGICAL LIGHT HANDLE (MISCELLANEOUS) ×2 IMPLANT
COVER TIP SHEARS 8 DVNC (MISCELLANEOUS) ×1 IMPLANT
DEFOGGER SCOPE WARM SEASHARP (MISCELLANEOUS) ×1 IMPLANT
DERMABOND ADVANCED .7 DNX12 (GAUZE/BANDAGES/DRESSINGS) IMPLANT
DEVICE TROCAR PUNCTURE CLOSURE (ENDOMECHANICALS) IMPLANT
DRAIN CHANNEL 19F RND (DRAIN) ×1 IMPLANT
DRAPE ARM DVNC X/XI (DISPOSABLE) ×4 IMPLANT
DRAPE COLUMN DVNC XI (DISPOSABLE) ×1 IMPLANT
DRAPE CV SPLIT W-CLR ANES SCRN (DRAPES) ×1 IMPLANT
DRAPE PERI GROIN 82X75IN TIB (DRAPES) ×1 IMPLANT
DRAPE SURG IRRIG POUCH 19X23 (DRAPES) ×1 IMPLANT
DRIVER NDL LRG 8 DVNC XI (INSTRUMENTS) ×1 IMPLANT
DRIVER NDLE LRG 8 DVNC XI (INSTRUMENTS) IMPLANT
DRSG OPSITE POSTOP 4X10 (GAUZE/BANDAGES/DRESSINGS) IMPLANT
DRSG OPSITE POSTOP 4X6 (GAUZE/BANDAGES/DRESSINGS) IMPLANT
DRSG OPSITE POSTOP 4X8 (GAUZE/BANDAGES/DRESSINGS) IMPLANT
DRSG TEGADERM 2-3/8X2-3/4 SM (GAUZE/BANDAGES/DRESSINGS) ×5 IMPLANT
DRSG TEGADERM 4X4.75 (GAUZE/BANDAGES/DRESSINGS) ×1 IMPLANT
ELECT PENCIL ROCKER SW 15FT (MISCELLANEOUS) IMPLANT
ELECT REM PT RETURN 15FT ADLT (MISCELLANEOUS) ×1 IMPLANT
ENDOLOOP SUT PDS II 0 18 (SUTURE) IMPLANT
EVACUATOR SILICONE 100CC (DRAIN) ×1 IMPLANT
FORCEPS BPLR FENES DVNC XI (FORCEP) IMPLANT
GAUZE SPONGE 2X2 8PLY STRL LF (GAUZE/BANDAGES/DRESSINGS) ×1 IMPLANT
GAUZE SPONGE 4X4 12PLY STRL (GAUZE/BANDAGES/DRESSINGS) IMPLANT
GLOVE BIO SURGEON STRL SZ7.5 (GLOVE) ×3 IMPLANT
GLOVE INDICATOR 8.0 STRL GRN (GLOVE) ×3 IMPLANT
GOWN SRG XL LVL 4 BRTHBL STRL (GOWNS) ×1 IMPLANT
GOWN STRL REUS W/ TWL XL LVL3 (GOWN DISPOSABLE) ×5 IMPLANT
GRASPER SUT TROCAR 14GX15 (MISCELLANEOUS) IMPLANT
GRASPER TIP-UP FEN DVNC XI (INSTRUMENTS) ×1 IMPLANT
HOLDER FOLEY CATH W/STRAP (MISCELLANEOUS) ×1 IMPLANT
IRRIGATION SUCT STRKRFLW 2 WTP (MISCELLANEOUS) ×1 IMPLANT
KIT PROCEDURE DVNC SI (MISCELLANEOUS) IMPLANT
KIT TURNOVER KIT A (KITS) IMPLANT
LIGASURE IMPACT 36 18CM CVD LR (INSTRUMENTS) IMPLANT
NDL INSUFFLATION 14GA 120MM (NEEDLE) ×1 IMPLANT
NEEDLE INSUFFLATION 14GA 120MM (NEEDLE) ×1 IMPLANT
PACK COLON (CUSTOM PROCEDURE TRAY) ×1 IMPLANT
PAD POSITIONING PINK XL (MISCELLANEOUS) ×1 IMPLANT
PENCIL SMOKE EVACUATOR (MISCELLANEOUS) IMPLANT
PROTECTOR NERVE ULNAR (MISCELLANEOUS) ×2 IMPLANT
RELOAD PROXIMATE 75MM BLUE (ENDOMECHANICALS) ×2 IMPLANT
RELOAD STAPLE 45 3.5 BLU DVNC (STAPLE) IMPLANT
RELOAD STAPLE 45 4.3 GRN DVNC (STAPLE) IMPLANT
RELOAD STAPLE 60 3.5 BLU DVNC (STAPLE) IMPLANT
RELOAD STAPLE 60 4.3 GRN DVNC (STAPLE) IMPLANT
RELOAD STAPLE 75 3.8 BLU REG (ENDOMECHANICALS) IMPLANT
RELOAD STAPLER 3.5X45 BLU DVNC (STAPLE) IMPLANT
RELOAD STAPLER 3.5X60 BLU DVNC (STAPLE) IMPLANT
RELOAD STAPLER 4.3X45 GRN DVNC (STAPLE) IMPLANT
RELOAD STAPLER 4.3X60 GRN DVNC (STAPLE) IMPLANT
RETRACTOR WND ALEXIS 18 MED (MISCELLANEOUS) IMPLANT
SCISSORS LAP 5X35 DISP (ENDOMECHANICALS) IMPLANT
SCISSORS MNPLR CVD DVNC XI (INSTRUMENTS) ×1 IMPLANT
SEAL UNIV 5-12 XI (MISCELLANEOUS) ×4 IMPLANT
SEALER VESSEL EXT DVNC XI (MISCELLANEOUS) ×1 IMPLANT
SLEEVE ADV FIXATION 5X100MM (TROCAR) IMPLANT
SOLUTION ELECTROSURG ANTI STCK (MISCELLANEOUS) ×1 IMPLANT
SPIKE FLUID TRANSFER (MISCELLANEOUS) ×1 IMPLANT
STAPLER 60 SUREFORM DVNC (STAPLE) IMPLANT
STAPLER 90 3.5 STD SLIM (STAPLE) IMPLANT
STAPLER ECHELON POWER CIR 29 (STAPLE) IMPLANT
STAPLER ECHELON POWER CIR 31 (STAPLE) IMPLANT
STAPLER PROXIMATE 75MM BLUE (STAPLE) IMPLANT
STOPCOCK 4 WAY LG BORE MALE ST (IV SETS) ×2 IMPLANT
SURGILUBE 2OZ TUBE FLIPTOP (MISCELLANEOUS) ×1 IMPLANT
SUT MNCRL AB 4-0 PS2 18 (SUTURE) ×1 IMPLANT
SUT PDS AB 1 CT1 27 (SUTURE) IMPLANT
SUT PDS AB 1 TP1 96 (SUTURE) IMPLANT
SUT PROLENE 0 CT 2 (SUTURE) IMPLANT
SUT PROLENE 2 0 KS (SUTURE) ×1 IMPLANT
SUT PROLENE 2 0 SH DA (SUTURE) IMPLANT
SUT SILK 2 0 SH CR/8 (SUTURE) IMPLANT
SUT SILK 2-0 18XBRD TIE 12 (SUTURE) IMPLANT
SUT SILK 3 0 SH CR/8 (SUTURE) ×1 IMPLANT
SUT SILK 3-0 18XBRD TIE 12 (SUTURE) ×1 IMPLANT
SUT VIC AB 3-0 SH 18 (SUTURE) IMPLANT
SUT VIC AB 3-0 SH 27XBRD (SUTURE) IMPLANT
SUT VICRYL 0 UR6 27IN ABS (SUTURE) ×1 IMPLANT
SUTURE V-LC BRB 180 2/0GR6GS22 (SUTURE) IMPLANT
SYR 10ML LL (SYRINGE) ×1 IMPLANT
SYSTEM LAPSCP GELPORT 120MM (MISCELLANEOUS) IMPLANT
SYSTEM WOUND ALEXIS 18CM MED (MISCELLANEOUS) ×1 IMPLANT
TAPE UMBILICAL 1/8 X36 TWILL (MISCELLANEOUS) ×1 IMPLANT
TRAY FOLEY MTR SLVR 16FR STAT (SET/KITS/TRAYS/PACK) ×1 IMPLANT
TROCAR ADV FIXATION 5X100MM (TROCAR) ×1 IMPLANT
TUBING CONNECTING 10 (TUBING) ×3 IMPLANT
TUBING INSUFFLATION 10FT LAP (TUBING) ×1 IMPLANT

## 2024-02-01 NOTE — H&P (Signed)
 CC: Here today for surgery  HPI: Tara Morgan is an 63 y.o. female with history of HTN, tobacco use, whom is seen in the office today as a referral by Dr. Alita Irwin for evaluation of colon cancer.  Colonoscopy Dr. Alita Irwin 01/02/24: -Likely malignant tumor in descending colon. Biopsy. Tattooed. 50 cm from anal verge. - Nine 3-10 mm polyps removed - One 20 mm polyp asc removed - One 25 mm polyp in transverse removed - One 15 mm polyp in transverse removed - Nonbleeding terminal hemorrhoids  PATH A. COLON, CECAL, ASCENDING, HEPATIC FLEXTURE, TRANSVERSE, POLYPECTOMY:  - Tubular adenoma(s)  - Negative for high-grade dysplasia or malignancy   B. COLON MASS, DESCENDING, BIOPSY:  - At least intramucosal adenocarcinoma, see comment   CT CAP 01/03/24 Slight wall thickening and stranding along the splenic flexure, proximal descending colon. Please correlate for the location of colonic neoplasm. There is a small bubble of air along the wall of the bowel in this location medially. Favor this being a a diverticula with air rather than a bubble of free air. Please correlate with clinical history and if needed short follow-up. No signs of obstruction. No free-fluid. No widespread free air.  No developing lymph node enlargement. Tiny hepatic cystic focus. No separate space-occupying liver lesion.  Several small lung nodules as seen on prior chest CT. Recommend continued surveillance as per prior.  Coronary artery calcifications. Please correlate for other coronary risk factors.  CEA 01/10/24: 1.9  This was her first colonoscopy. She denied any symptoms proceeding this including abdominal pain, blood in her stool, or other concerns.  She denies any changes in health or health history since we met in the office. No new medications/allergies. She states she is ready for surgery today.  PMH: HTN, tobacco use  PSH: Laparoscopic cholecystectomy, 1986, [facility]  FHx: Denies any known  family history of colorectal, breast, endometrial or ovarian cancer  Social Hx: Smokes 1 pack/day; denies use of EtOH/illicit drug. She works as a Programme researcher, broadcasting/film/video. She is here today with her sister and daughter.   Past Medical History:  Diagnosis Date   Cancer [facility])    colon   COPD (chronic obstructive pulmonary disease) (HCC)    Hypertension     Past Surgical History:  Procedure Laterality Date   BREAST BIOPSY Right 04/02/2017   FIBROADENFIBROCYSTIC CHANGES AND ADENOSIS WITH CALCIFICATIONS   CATARACT EXTRACTION W/PHACO Left 01/03/2016   Procedure: CATARACT EXTRACTION PHACO AND INTRAOCULAR LENS PLACEMENT (IOC);  Surgeon: Anner Kill, MD;  Location: [facility] ORS;  Service: Ophthalmology;  Laterality: Left;  CDE:5.86   CATARACT EXTRACTION W/PHACO Right 02/21/2016   Procedure: CATARACT EXTRACTION PHACO AND INTRAOCULAR LENS PLACEMENT RIGHT EYE; CDE:  6.40;  Surgeon: Anner Kill, MD;  Location: [facility] ORS;  Service: Ophthalmology;  Laterality: Right;   CHOLECYSTECTOMY     COLONOSCOPY N/A 01/02/2024   Procedure: COLONOSCOPY;  Surgeon: Hargis Lias, MD;  Location: [facility] ENDO SUITE;  Service: Endoscopy;  Laterality: N/A;  1:30PM;ASA 1-2   JOINT REPLACEMENT Left 2022   hip replacement    Family History  Problem Relation Age of Onset   Dementia Mother    Heart failure Father     Social:  reports that she has quit smoking. Her smoking use included cigarettes. She has a 45 pack-year smoking history. She has never used smokeless tobacco. She reports current alcohol use. She reports that she does not use drugs.  Allergies: No Known Allergies  Medications: I have reviewed the patient's  current medications.  Results for orders placed or performed during the hospital encounter of 02/01/24 (from the past 48 hours)  ABO/Rh     Status: None   Collection Time: 02/01/24  6:11 AM  Result Value Ref Range   ABO/RH(D)      A POS Performed at [facility], [address]  [place], [place] [address]     No results found.   PE Blood pressure (!) 144/75, pulse (!) 50, temperature (!) 97.5 F (36.4 C), temperature source Oral, resp. rate 16, height 5\' 5"  (1.651 m), weight 75 kg, SpO2 99%. Constitutional: NAD; conversant Eyes: Moist conjunctiva; no lid lag; anicteric Lungs: Normal respiratory effort CV: RRR GI: Abd soft, NT/ND Psychiatric: Appropriate affect  Results for orders placed or performed during the hospital encounter of 02/01/24 (from the past 48 hours)  ABO/Rh     Status: None   Collection Time: 02/01/24  6:11 AM  Result Value Ref Range   ABO/RH(D)      A POS Performed at [facility], [address] [place], [place] [address]     No results found.  A/P: Tara Morgan is an 63 y.o. female with hx of HTN, tobacco use here for evaluation of newly diagnosed descending versus splenic flexure colon cancer  Colonoscopy 01/02/2024-descending colon mass tattooed and biopsied. At least intramucosal adenocarcinoma on pathology.  CT chest/abdomen/pelvis 01/03/24-no evidence of metastatic disease; this was a day after her colonoscopy and there was a blip of air seen adjacent to her splenic flexure as well as some thickening at this location. I suspect this is related to the tattooing process.  -Genetics referral given personal number of polyps and her cancer diagnoses - Thyroid  ultrasound 01/17/2024  -The anatomy and physiology of the GI tract was reviewed with the patient. The pathophysiology of colon polyps and cancer was discussed as well with associated pictures. -We have discussed various different treatment options going forward including surgery (the most definitive) to address this -robotic assisted partial colectomy; possible takedown of splenic flexure; possible flexible sigmoidoscopy -The planned procedure, material risks (including, but not limited to, pain, bleeding, infection, scarring, need for blood transfusion, damage to  surrounding structures- blood vessels/nerves/viscus/organs, damage to ureter, urine leak, leak from anastomosis, need for additional procedures, low anterior resection syndrome (LARS) = increased fecal urgency and/or frequency, scenarios where a stoma may be necessary and where it may be permanent, worsening of pre-existing medical conditions, chronic diarrhea, constipation secondary to narcotic use, hernia, recurrence, pneumonia, heart attack, stroke, death) benefits and alternatives to surgery were discussed at length. The patient's questions were answered to her and her family's satisfaction, they voiced understanding and elected to proceed with surgery. Additionally, we discussed typical postoperative expectations and the recovery process.   Beatris Lincoln, MD [facility]

## 2024-02-01 NOTE — Plan of Care (Signed)

## 2024-02-01 NOTE — Anesthesia Procedure Notes (Signed)
 Procedure Name: Intubation Date/Time: 02/01/2024 7:41 AM  Performed by: Elaina Graver, CRNAPre-anesthesia Checklist: Patient identified, Emergency Drugs available, Suction available and Patient being monitored Patient Re-evaluated:Patient Re-evaluated prior to induction Oxygen Delivery Method: Circle System Utilized Preoxygenation: Pre-oxygenation with 100% oxygen Induction Type: IV induction Ventilation: Mask ventilation without difficulty Laryngoscope Size: Mac Grade View: Grade II Tube type: Oral Tube size: 7.0 mm Number of attempts: 1 Airway Equipment and Method: Stylet Placement Confirmation: ETT inserted through vocal cords under direct vision, positive ETCO2 and breath sounds checked- equal and bilateral Secured at: 21 cm Tube secured with: Tape Dental Injury: Teeth and Oropharynx as per pre-operative assessment

## 2024-02-01 NOTE — Op Note (Signed)
 PATIENT: Tara Morgan  63 y.o. female  Patient Care Team: Veda Gerald, MD as PCP - General (Internal Medicine)  PREOP DIAGNOSIS: COLON CANCER  POSTOP DIAGNOSIS: COLON CANCER  PROCEDURE:  Robotic-assisted partial colectomy (splenic flexure) Takedown of splenic flexure Bilateral transversus abdominus plane (TAP) blocks  SURGEON: Renford Cartwright. Hildegard Hlavac, MD  ASSISTANT: Joyce Nixon, MD  An experienced assistant was required given the complexity of this procedure and the standard of surgical care. My assistant helped with exposure through counter tension, suctioning, ligation and retraction to better visualize the surgical field. My assistant expedited sewing during the case by following my sutures. Wherever I use the term "we" in the report, my assistant actively helped me with that portion of the procedure.   ANESTHESIA: General endotracheal  EBL: 50 mL Total I/O In: 350 [IV Piggyback:350] Out: 50 [Blood:50]  SPECIMEN: Splenic flexure-stitch distal staple line  COUNTS: Sponge, needle and instrument counts were reported correct x2  FINDINGS: Tattoo and evident mass at the level of the splenic flexure.  Normal peritoneal surfaces.  Normal liver surface.  Splenic flexure was mobilized to facilitate removal.  Segmental colectomy carried out of the section.  A well-perfused, tension-free, hemostatic side-to-side, functional end-to-end anastomosis was fashioned between the mid transverse colon and the mid descending colon.   NARRATIVE: Informed consent was verified. The patient was taken to the operating room, placed supine on the operating table and SCD's were applied. General endotracheal anesthesia was induced without difficulty.  She was then positioned in the lithotomy position with Allen stirrups.  Pressure points were evaluated and padded.  A foley catheter was then placed by nursing under sterile conditions. Hair on the abdomen was clipped.  She was secured to the operating  table. The abdomen was then prepped and draped in the standard sterile fashion. Surgical timeout was called indicating the correct patient, procedure, positioning and need for preoperative antibiotics.   An OG tube was placed by anesthesia and confirmed to be to suction.  At Palmer's point, a stab incision was created and the Veress needle was introduced into the peritoneal cavity on the first attempt.  Intraperitoneal location was confirmed by the aspiration and saline drop test.  Pneumoperitoneum was established to a maximum pressure of 15 mmHg using CO2.  Following this, the abdomen was marked for planned trocar sites.  Just to the right and cephalad to the umbilicus, an 8 mm incision was created and an 8 mm blunt tipped robotic trocar was cautiously placed into the peritoneal cavity.  The laparoscope was inserted and demonstrated no evidence of trocar site nor Veress needle site complications.  The Veress needle was removed.  Bilateral transversus abdominis plane blocks were then created using a dilute mixture of Exparel with Marcaine.  3 additional 8 mm robotic trochars were placed under direct visualization roughly in a line extending from the right ASIS towards the right upper quadrant. An additional 5 mm assist port was placed in the right lateral abdomen under direct visualization.  The abdomen was surveyed and there was normal peritoneal surfaces.  Normal liver surface.  Tattoo was identified in the level of the splenic flexure.  She was positioned in slight reverse Trendelenburg with the left side tilted slightly up.  Small bowel was carefully retracted out of the pelvis.  The robot was then docked and I went to the console.  We began by mobilizing the splenic flexure.  We started at the level of the mid transverse colon.  The omentum was  elevated anteriorly and the transverse colon is retracted caudad.  The lesser sac is opened and readily gained.  The omentum is freed from its attachments to the mid  and distal transverse colon as well as splenic flexure and left colon.  The splenic flexure is then fully mobilized from this approach working from the transverse colon down to the descending colon.  The associate mesocolon is reflected medially as well.  The left kidney/Gerota's fascia is noted and the mesentery is freed from the surface.  The descending colon is then incised by incising the Lanis Storlie line of Toldt all the way down to the level of the sigmoid.  The associate mesocolon was reflected medially.   Attention was turned to the extracorporeal portion of the procedure.  The robot was undocked.  I scrubbed back in.  An extraction site incision was created in the upper midline.  The skin is incised.  Subcutaneous tissue was dissected and the fascia was incised in the midline.  Peritoneum is carefully entered bluntly.  An Alexis wound protector was placed.  Towels were placed on the field.  The splenic flexure was brought out through the wound protector.  There is a mass noted at this level with a tattoo as well.  We went at least 5 cm proximally and distally.  On the level of the transverse colon, we went up to the left branch of middle colic's leaving the middle colic pedicle perfusing the remnant transverse colon.  Distally, it well beyond the mass at least 5 cm as well.  We created the wound in the mesentery to each respective side.  We then ligated and divided the mesentery using a hand-held LigaSure device taking a wide swath of mesentery at the level of the splenic flexure for lymph node purposes.  A stitch was placed on the distal staple line orienting the specimen.  The specimen was passed off.  The abdomen is inspected.  Hemostasis is verified.  Attention is then directed to creating the colocolonic anastomosis.  At the level of the staple in the transverse colon, there is a palpable pulse in the mesentery.  At the level of the staple line on the descending colon, there is also a palpable pulse in  the mesentery.  These are clearly well-perfused in appearance.  They are able to be brought together any tension as both sides are fairly mobile.  On each respective antimesenteric tenia, a window is created at the level of the staple line.  A 75 mm GIA blue load stapler was then used to create a side-to-side, functional end-to-end colocolonic anastomosis.  The staple line is inspected.  Hemostasis is verified.  There are well-formed staples.  The common colotomy is then closed using a TA 90 blue stapler.  The anastomosis is palpated and found to be approximately 4 fingerbreadths in diameter, widely patent.  A 2-0 silk sutures placed at the apex anastomosis.  The corners of the TA staple line are also dunked using 2-0 silk suture.  The anastomosis is pink in color.  There is no tension on it.  This is placed back into the abdomen.  Omentum was brought down over it.  The abdomen is irrigated.  Hemostasis is verified.  We switched over to the clean portion the procedure.  All gown/gloves were changed.  Additional sterile drapes placed from the field and new equipment is used.  The midline extraction site fascia is closed using 2 running #1 PDS sutures.  Additional local anesthetic is infiltrated  at this location.  Sponge, needle, and counts reported correct x 2.  4 Monocryl subcuticular sutures were used to close the skin of all incision sites.   She was then taken out of lithotomy position, awakened from anesthesia, extubated, and transferred to a stretcher for transport to recovery in satisfactory condition.

## 2024-02-01 NOTE — Discharge Instructions (Signed)

## 2024-02-01 NOTE — TOC Progression Note (Addendum)
 Transition of Care [facility]) - Progression Note    Patient Details  Name: Tara Morgan MRN: [id number] Date of Birth: 1961-07-07  Transition of [facility] CM/SW Contact  Levie Ream, RN Phone Number: 02/01/2024, 4:11 PM  Clinical Narrative:    Received message from Gaines Joy, LPN that [facility] from [facility] just called to speak with social worker regarding discharge planning; she can be contacted at [phone]; LVM; awaiting return call.  [phone]- return call from [place], RN, CM from [facility]; she says she will be out of office until Tuesday; Marlin Simmonds also says if pt needs HH services the following agencies are in-network: [facility], [facility], [place], [facility], and [employer].       Expected Discharge Plan and Services                                               Social Determinants of Health (SDOH) Interventions SDOH Screenings   Food Insecurity: No Food Insecurity (02/01/2024)  Housing: Low Risk  (02/01/2024)  Transportation Needs: No Transportation Needs (02/01/2024)  Utilities: Not At Risk (02/01/2024)  Depression (PHQ2-9): Low Risk  (01/10/2024)  Tobacco Use: Medium Risk (02/01/2024)    Readmission Risk Interventions     No data to display

## 2024-02-01 NOTE — Anesthesia Postprocedure Evaluation (Signed)
 Anesthesia Post Note  Patient: Tara Morgan  Procedure(s) Performed: COLECTOMY, PARTIAL, ROBOT-ASSISTED, LAPAROSCOPIC AND TAKEDOWN OF SPLENIC FLEXURE     Patient location during evaluation: PACU Anesthesia Type: General Level of consciousness: awake and alert Pain management: pain level controlled Vital Signs Assessment: post-procedure vital signs reviewed and stable Respiratory status: spontaneous breathing, nonlabored ventilation, respiratory function stable and patient connected to nasal cannula oxygen Cardiovascular status: blood pressure returned to baseline and stable Postop Assessment: no apparent nausea or vomiting Anesthetic complications: no   No notable events documented.  Last Vitals:  Vitals:   02/01/24 1115 02/01/24 1117  BP: 131/68 131/68  Pulse: (!) 45 (!) 56  Resp: 10 19  Temp:    SpO2: 100% 100%    Last Pain:  Vitals:   02/01/24 1117  TempSrc:   PainSc: 5                  Lethaniel Rave

## 2024-02-01 NOTE — Transfer of Care (Signed)
 Immediate Anesthesia Transfer of Care Note  Patient: Tara Morgan  Procedure(s) Performed: COLECTOMY, PARTIAL, ROBOT-ASSISTED, LAPAROSCOPIC AND TAKEDOWN OF SPLENIC FLEXURE  Patient Location: PACU  Anesthesia Type:General  Level of Consciousness: drowsy  Airway & Oxygen Therapy: Patient Spontanous Breathing and Patient connected to face mask oxygen  Post-op Assessment: Report given to RN and Post -op Vital signs reviewed and unstable, Anesthesiologist notified  Post vital signs: Reviewed  Last Vitals:  Vitals Value Taken Time  BP 186/79 02/01/24 1004  Temp 36.4 C 02/01/24 1000  Pulse 56 02/01/24 1011  Resp 27 02/01/24 1011  SpO2 99 % 02/01/24 1011  Vitals shown include unfiled device data.  Last Pain:  Vitals:   02/01/24 0549  TempSrc: Oral  PainSc: 0-No pain         Complications: No notable events documented.

## 2024-02-02 LAB — CBC
HCT: 33.4 % — ABNORMAL LOW (ref 36.0–46.0)
Hemoglobin: 11 g/dL — ABNORMAL LOW (ref 12.0–15.0)
MCH: 35.6 pg — ABNORMAL HIGH (ref 26.0–34.0)
MCHC: 32.9 g/dL (ref 30.0–36.0)
MCV: 108.1 fL — ABNORMAL HIGH (ref 80.0–100.0)
Platelets: 176 10*3/uL (ref 150–400)
RBC: 3.09 MIL/uL — ABNORMAL LOW (ref 3.87–5.11)
RDW: 13.8 % (ref 11.5–15.5)
WBC: 6.9 10*3/uL (ref 4.0–10.5)
nRBC: 0 % (ref 0.0–0.2)

## 2024-02-02 LAB — BASIC METABOLIC PANEL WITH GFR
Anion gap: 7 (ref 5–15)
BUN: 11 mg/dL (ref 8–23)
CO2: 23 mmol/L (ref 22–32)
Calcium: 8.6 mg/dL — ABNORMAL LOW (ref 8.9–10.3)
Chloride: 107 mmol/L (ref 98–111)
Creatinine, Ser: 0.79 mg/dL (ref 0.44–1.00)
GFR, Estimated: >60 mL/min (ref >60)
Glucose, Bld: 91 mg/dL (ref 70–99)
Potassium: 3.7 mmol/L (ref 3.5–5.1)
Sodium: 137 mmol/L (ref 135–145)

## 2024-02-02 MED ORDER — OXYCODONE HCL 5 MG PO TABS
5.0000 mg | ORAL_TABLET | Freq: Four times a day (QID) | ORAL | Status: DC | PRN
  Administered 2024-02-02 – 2024-02-03 (×3): 5 mg via ORAL
  Filled 2024-02-02 (×3): qty 1

## 2024-02-02 NOTE — Progress Notes (Signed)
 Mobility Specialist - Progress Note   02/02/24 0859  Mobility  Activity Ambulated independently in hallway  Level of Assistance Independent  Assistive Device None  Distance Ambulated (ft) 500 ft  Activity Response Tolerated well  Mobility Referral Yes  Mobility visit 1 Mobility  Mobility Specialist Start Time (ACUTE ONLY) 0849  Mobility Specialist Stop Time (ACUTE ONLY) 0859  Mobility Specialist Time Calculation (min) (ACUTE ONLY) 10 min   Pt received in bed and agreeable to mobility. No complaints during session. Pt to bed after session with all needs met.    [facility]

## 2024-02-02 NOTE — Progress Notes (Signed)
 1 Day Post-Op   Subjective/Chief Complaint: Uneventful night. Sore. No nausea.   Objective: Vital signs in last 24 hours: Temp:  [97.4 F (36.3 C)-98.5 F (36.9 C)] 98.5 F (36.9 C) (05/24 0526) Pulse Rate:  [45-62] 62 (05/24 0526) Resp:  [7-20] 18 (05/24 0526) BP: (131-191)/(64-84) 131/84 (05/24 0526) SpO2:  [96 %-100 %] 97 % (05/24 0526) Weight:  [79.5 kg] 79.5 kg (05/24 0526) Last BM Date : 02/01/24  Intake/Output from previous day: 05/23 0701 - 05/24 0700 In: 2702.5 [P.O.:1020; I.V.:1332.5; IV Piggyback:350] Out: 850 [Urine:800; Blood:50] Intake/Output this shift: Total I/O In: -  Out: 400 [Urine:400]  A&ox3 Unlabored respirations Abd s/nd/appropriate peri-incisional tenderness. Incisions c/d/I with dermabond, no cellulitis or hematoma  Lab Results:  Recent Labs    02/02/24 0538  WBC 6.9  HGB 11.0*  HCT 33.4*  PLT 176   BMET Recent Labs    02/02/24 0538  NA 137  K 3.7  CL 107  CO2 23  GLUCOSE 91  BUN 11  CREATININE 0.79  CALCIUM 8.6*   PT/INR No results for input(s): "LABPROT", "INR" in the last 72 hours. ABG No results for input(s): "PHART", "HCO3" in the last 72 hours.  Invalid input(s): "PCO2", "PO2"  Studies/Results: No results found.  Anti-infectives: Anti-infectives (From admission, onward)    Start     Dose/Rate Route Frequency Ordered Stop   02/01/24 1400  neomycin  (MYCIFRADIN ) tablet 1,000 mg  Status:  Discontinued       Placed in "And" Linked Group   1,000 mg Oral 3 times per day 02/01/24 0531 02/01/24 0541   02/01/24 1400  metroNIDAZOLE (FLAGYL) tablet 1,000 mg  Status:  Discontinued       Placed in "And" Linked Group   1,000 mg Oral 3 times per day 02/01/24 0531 02/01/24 0541   02/01/24 0600  cefoTEtan (CEFOTAN) 2 g in sodium chloride  0.9 % 100 mL IVPB        2 g 200 mL/hr over 30 Minutes Intravenous On call to O.R. 02/01/24 0531 02/01/24 0813       Assessment/Plan: s/p Procedure(s) with comments: COLECTOMY, PARTIAL,  ROBOT-ASSISTED, LAPAROSCOPIC AND TAKEDOWN OF SPLENIC FLEXURE (N/A) - ROBOTIC PARTIAL COLECTOMY, POSSIBLE TAKEDOWN OF SPLENIC FLEXURE -Ambulate, pulm toilet -Dc foley, DC IVF -Soft diet -Repeat labs tomorrow- hgb 11 from 12.8. no clinical concern for bleeding at this point.    LOS: 1 day    Tara Morgan 02/02/2024

## 2024-02-02 NOTE — Plan of Care (Signed)
   Problem: Education: Goal: Understanding of discharge needs will improve Outcome: Progressing

## 2024-02-02 NOTE — Plan of Care (Signed)
  Problem: Education: Goal: Understanding of discharge needs will improve Outcome: Progressing   Problem: Activity: Goal: Ability to tolerate increased activity will improve Outcome: Progressing   Problem: Bowel/Gastric: Goal: Gastrointestinal status for postoperative course will improve Outcome: Progressing   Problem: Nutritional: Goal: Will attain and maintain optimal nutritional status will improve Outcome: Progressing   Problem: Clinical Measurements: Goal: Postoperative complications will be avoided or minimized Outcome: Progressing   Problem: Skin Integrity: Goal: Will show signs of wound healing Outcome: Progressing   Problem: Clinical Measurements: Goal: Ability to maintain clinical measurements within normal limits will improve Outcome: Progressing

## 2024-02-03 LAB — CBC
HCT: 33.4 % — ABNORMAL LOW (ref 36.0–46.0)
Hemoglobin: 10.8 g/dL — ABNORMAL LOW (ref 12.0–15.0)
MCH: 35.6 pg — ABNORMAL HIGH (ref 26.0–34.0)
MCHC: 32.3 g/dL (ref 30.0–36.0)
MCV: 110.2 fL — ABNORMAL HIGH (ref 80.0–100.0)
Platelets: 170 10*3/uL (ref 150–400)
RBC: 3.03 MIL/uL — ABNORMAL LOW (ref 3.87–5.11)
RDW: 13.9 % (ref 11.5–15.5)
WBC: 5.7 10*3/uL (ref 4.0–10.5)
nRBC: 0 % (ref 0.0–0.2)

## 2024-02-03 LAB — BASIC METABOLIC PANEL WITH GFR
Anion gap: 7 (ref 5–15)
BUN: 9 mg/dL (ref 8–23)
CO2: 24 mmol/L (ref 22–32)
Calcium: 9 mg/dL (ref 8.9–10.3)
Chloride: 107 mmol/L (ref 98–111)
Creatinine, Ser: 0.62 mg/dL (ref 0.44–1.00)
GFR, Estimated: >60 mL/min (ref >60)
Glucose, Bld: 94 mg/dL (ref 70–99)
Potassium: 3.4 mmol/L — ABNORMAL LOW (ref 3.5–5.1)
Sodium: 138 mmol/L (ref 135–145)

## 2024-02-03 LAB — MAGNESIUM: Magnesium: 1.6 mg/dL — ABNORMAL LOW (ref 1.7–2.4)

## 2024-02-03 MED ORDER — MAGNESIUM OXIDE -MG SUPPLEMENT 400 (240 MG) MG PO TABS
800.0000 mg | ORAL_TABLET | Freq: Once | ORAL | Status: AC
  Administered 2024-02-03: 800 mg via ORAL
  Filled 2024-02-03: qty 2

## 2024-02-03 MED ORDER — POTASSIUM CHLORIDE 20 MEQ PO PACK
40.0000 meq | PACK | Freq: Once | ORAL | Status: AC
  Administered 2024-02-03: 40 meq via ORAL
  Filled 2024-02-03: qty 2

## 2024-02-03 NOTE — Progress Notes (Signed)
 Assessment unchanged. Pt and daughter verbalized understanding of dc instructions including medications to resume, follow up care and when to call MD. Dc'd via wc to front entrance accompanied by NT.

## 2024-02-03 NOTE — Discharge Summary (Signed)
 Physician Discharge Summary  Patient ID: Tara Morgan MRN: [id number] DOB/AGE: 1961-01-28 63 y.o.  Admit date: 02/01/2024 Discharge date: 02/03/2024  Admission Diagnoses:  Discharge Diagnoses:  Principal Problem:   S/P partial colectomy   Discharged Condition: good  Hospital Course: Patient was admitted for routine postoperative care following robotic splenic flexure resection.  She progressed well and on postop day 2 began having bowel movements.  She is tolerating a soft diet without nausea or abdominal bloating.  Pain is well-controlled with oral medications.  She is ambulating independently.  She is deemed stable for discharge home.  Consults: None  Significant Diagnostic Studies: labs: See epic.  Did have mildly low potassium and magnesium on postop day 2, p.o. replacements ordered  Treatments: surgery: As above  Discharge Exam: Blood pressure (!) 174/81, pulse 64, temperature 98.4 F (36.9 C), temperature source Oral, resp. rate 18, height 5\' 5"  (1.651 m), weight 78.1 kg, SpO2 93%. Alert, well-appearing Unlabored respirations Abdomen is soft, nondistended, mildly appropriately tender mostly around upper midline incision.  Incisions are all clean, dry and intact without cellulitis or hematoma.  Mild ecchymosis evolving around upper midline incision, approximately 2 cm radius  Disposition: Discharge disposition: 01-Home or Self Care      Allergies as of 02/03/2024   No Known Allergies      Medication List     TAKE these medications    acetaminophen 500 MG tablet Commonly known as: TYLENOL Take 1,000 mg by mouth every 6 (six) hours as needed for moderate pain (pain score 4-6).   albuterol 108 (90 Base) MCG/ACT inhaler Commonly known as: VENTOLIN HFA Inhale 1 puff into the lungs every 6 (six) hours as needed for shortness of breath.   B-12 PO Take 1 capsule by mouth daily.   Breo Ellipta 100-25 MCG/ACT Aepb Generic drug: fluticasone   furoate-vilanterol Inhale 1 puff into the lungs daily as needed (shortness of breath).   gabapentin  100 MG capsule Commonly known as: NEURONTIN  Take 100 mg by mouth 3 (three) times daily as needed (pain).   hydrALAZINE 25 MG tablet Commonly known as: APRESOLINE Take 25 mg by mouth 2 (two) times daily.   nicotine 21 mg/24hr patch Commonly known as: NICODERM CQ - dosed in mg/24 hours Place 21 mg onto the skin daily.   olmesartan 40 MG tablet Commonly known as: BENICAR Take 40 mg by mouth daily.   sodium bicarbonate 650 MG tablet Take 650 mg by mouth daily.   traMADol 50 MG tablet Commonly known as: Ultram Take 1 tablet (50 mg total) by mouth every 6 (six) hours as needed for up to 5 days (postop pain not controlled with tylenol and ibuprofen first).   VITAMIN D PO Take 1 capsule by mouth daily.        Follow-up Information     Melvenia Stabs, MD Follow up on 02/18/2024.   Specialties: General Surgery, Colon and Rectal Surgery Why: Please arrive by 1:40 pm Contact information: [address] [place] [address] [place] [address] [phone]                 Signed: Adalberto Acton 02/03/2024, 9:35 AM

## 2024-02-03 NOTE — Plan of Care (Signed)

## 2024-02-06 ENCOUNTER — Ambulatory Visit: Payer: Self-pay | Admitting: Surgery

## 2024-02-06 LAB — SURGICAL PATHOLOGY

## 2024-02-13 ENCOUNTER — Other Ambulatory Visit: Payer: Self-pay

## 2024-02-14 NOTE — Progress Notes (Signed)
 The proposed treatment discussed in conference is for discussion purpose only and is not a binding recommendation.  The patients have not been physically examined, or presented with their treatment options.  Therefore, final treatment plans cannot be decided.

## 2024-02-25 ENCOUNTER — Inpatient Hospital Stay: Attending: Hematology | Admitting: Hematology

## 2024-02-25 ENCOUNTER — Inpatient Hospital Stay

## 2024-02-25 VITALS — BP 170/72 | HR 55 | Temp 97.8°F | Resp 19 | Wt 173.1 lb

## 2024-02-25 DIAGNOSIS — C185 Malignant neoplasm of splenic flexure: Secondary | ICD-10-CM

## 2024-02-25 NOTE — Progress Notes (Signed)
 [facility] [address], [place] [address]   Clinic Day:  02/25/2024  Referring physician: Veda Gerald, MD  Patient Care Team: Veda Gerald, MD as PCP - General (Internal Medicine)   ASSESSMENT & PLAN:   Assessment:  1.  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 - Pathology: Tubular adenoma in the cecal, ascending and hepatic flexure polyps.  Descending colon mass biopsy shows at least intramucosal adenocarcinoma. - CT CAP (01/03/2024): Slight wall thickening and stranding along the splenic flexure, proximal descending colon.  Small bubble of air along the wall of the bowel.  No lymphadenopathy or metastatic disease.  Several small subcentimeter lung nodules seen which are similar to lung cancer screening scan from March 2025. - Robotic assisted partial colectomy (splenic flexure) by Dr. Camilo Cella on 02/01/2024 - Pathology: Moderately differentiated invasive adenocarcinoma tumor size 3 cm, no lymphovascular invasion.  Perineural invasion identified.  Margins negative.  0/14 lymph nodes positive.  pT3 pN0.  MMR preserved. - Preoperative CEA: 1.9   2. [name] Independent of ADL's and IADL's. Tobacco use of 1 ppd since age 22. No asbestos exposure. No chemical exposures.  -Father had prostate cancer and lung cancer. Brother had liver cancer. Maternal aunt had breast cancer. Paternal uncle had brain brain cancer.   Plan:  1.  Stage II (T3 N0 M0) splenic flexure adenocarcinoma, MMR preserved: - She underwent partial colectomy of the splenic flexure by Dr. Camilo Cella on 02/01/2024.  She is recuperating from surgery well. - We discussed pathology report in detail.  She has 1 high risk feature of positive perineural invasion.  For stage II high risk disease,  options include observation/capecitabine (6 months)/CapeOx (3 months). - I have recommended Signatera test.  Will also request MSI test on pathology. - RTC 3 weeks for follow-up to discuss results and further plan.   Orders Placed This Encounter  Procedures   Signatera    Select as applicable. If patient is on or planning to receive immunotherapy, select drug: Not on Immunotherapy If Other or Multiple, Write down drug name:  Do not Delete Below This Line   ==========Department Information========== ID: [id number] Department:[facility] [place] - A DEPT OF [facility] [address] [place] [address] Dept: [phone] Dept Fax: [phone]    Standing Status:   Future    Number of Occurrences:   1    Expected Date:   02/25/2024    Expiration Date:   02/24/2025    Linard Reno to follow up with patient for sample collection (mobile phleb, lab, or saliva)::   No    Surveillance Program draw frequency::   Every 3 Months    Surveillance Program draw count::   4    Cancer type::   Colon    Stage of diagnosis::   II    History of recurrence?:   No    Current disease status::   No evidence of disease    Patient status::   Outpatient    By placing this electronic order I confirm the testing ordered herein is medically necessary and this patient has been informed of the details of the genetic test(s) ordered, including the risks, benefits, and alternatives, and has consented to testing.:   Yes    What type of billing?:  Bill Engineer, mining as a Neurosurgeon for [employer], MD.,have documented all relevant documentation on the behalf of Paulett Boros, MD,as directed by  Paulett Boros, MD while in the presence of Paulett Boros, MD.  I, Paulett Boros MD, have reviewed the above documentation for accuracy and completeness, and I agree with the above.    Paulett Boros, MD    6/16/202512:32 PM  CHIEF COMPLAINT/PURPOSE OF CONSULT:   Diagnosis: Descending colon cancer   Cancer Staging  Cancer of splenic flexure [facility]) Staging form: Colon and Rectum, AJCC 8th Edition - Clinical stage from 02/25/2024: Stage IIA (cT3, cN0, cM0) - Signed by Paulett Boros, MD on 02/25/2024    Prior Therapy: None  Current Therapy: Under workup   HISTORY OF PRESENT ILLNESS:   Oncology History  Cancer of splenic flexure (HCC)  02/25/2024 Initial Diagnosis   Cancer of splenic flexure (HCC)   02/25/2024 Cancer Staging   Staging form: Colon and Rectum, AJCC 8th Edition - Clinical stage from 02/25/2024: Stage IIA (cT3, cN0, cM0) - Signed by Paulett Boros, MD on 02/25/2024 Histopathologic type: Adenocarcinoma, NOS Stage prefix: Initial diagnosis Total positive nodes: 0 Total nodes examined: 14 Histologic grade (G): G2 Histologic grading system: 4 grade system Laterality: Left Tumor size (mm): 30 Lymph-vascular invasion (LVI): LVI not present (absent)/not identified Specimen type: Excision Carcinoembryonic antigen (CEA) (ng/mL): 1.9 Perineural invasion (PNI): Present Microsatellite instability (MSI): Stable KRAS mutation: Unknown NRAS mutation: Unknown BRAF mutation: Unknown       Eliette is a 63 y.o. female presenting to clinic today for evaluation of descending colon cancer at the request of Hargis Lias, MD.  Patient underwent colonoscopy on 01/02/24 with Dr. Alita Irwin, which found a fungating, polypoid and ulcerated non-obstructing large mass in the descending colon, 50 cm from anal verge. The mass was partially circumferential and was not bleeding. Nine sessile polyps, 3 to 10 mm in size, were found in the sigmoid colon, transverse colon, ascending colon, and cecum. A 20 mm sessile polyp was found in the ascending colon. A 25 mm sessile polyp was found in the transverse colon. A 15 mm sessile polyp was found in the transverse colon. Non-bleeding internal  hemorrhoids were also found.  Pathology of the descending colon mass revealed: at least intramucosal adenocarcinoma, though depth of invasion cannot be judged due to superficial nature of the biopsies. The polypectomies showed: tubular adenomas, negative for high-grade dysplasia or malignancy.   Addisynn had CT CAP on 01/03/24 that found: Slight wall thickening and stranding along the splenic flexure, proximal descending colon.There is a small bubble of air along the wall of the bowel in this location medially. Favor this being a diverticula with air rather than a bubble of free air. No signs of obstruction. No free-fluid. No widespread free air. No developing lymph node enlargement. Tiny hepatic cystic focus. No separate space-occupying liver lesion.  Today, she states that she is doing well overall. Her appetite level is at 75%. Her energy level is at 0%. She is accompanied by family members. Earle denies any pain after colonoscopy. She has an appointment with surgery, Dr. Camilo Cella, in [place] on 01/14/24. She denies any BRBPR or melena prior to colonoscopy. She denies any unintentional weight loss or new onset pains, including abdominal pain, in the last 6 months. No history of MI's, CVA's, or TIA's.   Daksha reports normal blood pressure at home and states she takes blood pressure medication. She had her PCP changed to Dr.  Hasanj a few months ago.   INTERVAL HISTORY:   WRENLEY SAYED is a 63 y.o. female presenting to the clinic today for follow-up of colon cancer. She was last seen by me on 01/10/24.  Since her last visit, she underwent partial colectomy and takedown of splenic flexure on 02/01/24 with Dr. Camilo Cella. Pathology of the colon revealed: moderately differentiated invasive adenocarcinoma with the tumor invading into the pericolorectal soft tissue and a size of 3.0 cm. All 14 lymph nodes were negative for metastatic adenocarcinoma.   Today, she states that she is doing well overall. Her appetite level is  at 90%. Her energy level is at 50%.   PAST MEDICAL HISTORY:   Past Medical History: Past Medical History:  Diagnosis Date   Cancer (HCC)    colon   COPD (chronic obstructive pulmonary disease) (HCC)    Hypertension     Surgical History: Past Surgical History:  Procedure Laterality Date   BREAST BIOPSY Right 04/02/2017   FIBROADENFIBROCYSTIC CHANGES AND ADENOSIS WITH CALCIFICATIONS   CATARACT EXTRACTION W/PHACO Left 01/03/2016   Procedure: CATARACT EXTRACTION PHACO AND INTRAOCULAR LENS PLACEMENT (IOC);  Surgeon: Anner Kill, MD;  Location: [facility];  Service: Ophthalmology;  Laterality: Left;  CDE:5.86   CATARACT EXTRACTION W/PHACO Right 02/21/2016   Procedure: CATARACT EXTRACTION PHACO AND INTRAOCULAR LENS PLACEMENT RIGHT EYE; CDE:  6.40;  Surgeon: Anner Kill, MD;  Location: [facility];  Service: Ophthalmology;  Laterality: Right;   CHOLECYSTECTOMY     COLONOSCOPY N/A 01/02/2024   Procedure: COLONOSCOPY;  Surgeon: Hargis Lias, MD;  Location: [facility];  Service: Endoscopy;  Laterality: N/A;  1:30PM;ASA 1-2   JOINT REPLACEMENT Left 2022   hip replacement    Social History: Social History   Socioeconomic History   Marital status: Widowed    Spouse name: Not on file   Number of children: Not on file   Years of education: Not on file   Highest education level: Not on file  Occupational History   Not on file  Tobacco Use   Smoking status: Former    Current packs/day: 1.00    Average packs/day: 1 pack/day for 45.0 years (45.0 ttl pk-yrs)    Types: Cigarettes   Smokeless tobacco: Never   Tobacco comments:    Quitt 1 week ago  Vaping Use   Vaping status: Former  Substance and Sexual Activity   Alcohol use: Yes    Comment: 2 times week   Drug use: No   Sexual activity: Yes    Birth control/protection: None  Other Topics Concern   Not on file  Social History Narrative   Not on file   Social Drivers of Health   Financial Resource Strain: Not on file  Food  Insecurity: No Food Insecurity (02/01/2024)   Hunger Vital Sign    Worried About Running Out of Food in the Last Year: Never true    Ran Out of Food in the Last Year: Never true  Transportation Needs: No Transportation Needs (02/01/2024)   PRAPARE - Administrator, Civil Service (Medical): No    Lack of Transportation (Non-Medical): No  Physical Activity: Not on file  Stress: Not on file  Social Connections: Not on file  Intimate Partner Violence: Not At Risk (02/01/2024)   Humiliation, Afraid, Rape, and Kick questionnaire    Fear of Current or Ex-Partner: No    Emotionally Abused: No    Physically Abused: No    Sexually Abused: No  Family History: Family History  Problem Relation Age of Onset   Dementia Mother    Heart failure Father     Current Medications:  Current Outpatient Medications:    acetaminophen  (TYLENOL ) 500 MG tablet, Take 1,000 mg by mouth every 6 (six) hours as needed for moderate pain (pain score 4-6)., Disp: , Rfl:    albuterol  (VENTOLIN  HFA) 108 (90 Base) MCG/ACT inhaler, Inhale 1 puff into the lungs every 6 (six) hours as needed for shortness of breath., Disp: , Rfl:    Cyanocobalamin  (B-12 PO), Take 1 capsule by mouth daily., Disp: , Rfl:    fluticasone  furoate-vilanterol (BREO ELLIPTA ) 100-25 MCG/ACT AEPB, Inhale 1 puff into the lungs daily as needed (shortness of breath)., Disp: , Rfl:    gabapentin  (NEURONTIN ) 100 MG capsule, Take 100 mg by mouth 3 (three) times daily as needed (pain)., Disp: , Rfl:    hydrALAZINE  (APRESOLINE ) 25 MG tablet, Take 25 mg by mouth 2 (two) times daily., Disp: , Rfl:    nicotine  (NICODERM CQ  - DOSED IN MG/24 HOURS) 21 mg/24hr patch, Place 21 mg onto the skin daily., Disp: , Rfl:    olmesartan (BENICAR) 40 MG tablet, Take 40 mg by mouth daily., Disp: , Rfl:    sodium bicarbonate  650 MG tablet, Take 650 mg by mouth daily., Disp: , Rfl:    VITAMIN D PO, Take 1 capsule by mouth daily., Disp: , Rfl:    Allergies: No  Known Allergies  REVIEW OF SYSTEMS:   Review of Systems  Constitutional:  Positive for fatigue. Negative for chills and fever.  HENT:   Negative for lump/mass, mouth sores, nosebleeds, sore throat and trouble swallowing.   Eyes:  Negative for eye problems.  Respiratory:  Negative for cough and shortness of breath.   Cardiovascular:  Negative for chest pain, leg swelling and palpitations.  Gastrointestinal:  Positive for abdominal pain. Negative for constipation, diarrhea, nausea and vomiting.  Genitourinary:  Negative for bladder incontinence, difficulty urinating, dysuria, frequency, hematuria and nocturia.   Musculoskeletal:  Negative for arthralgias, back pain, flank pain, myalgias and neck pain.  Skin:  Negative for itching and rash.  Neurological:  Negative for dizziness, headaches and numbness.  Hematological:  Does not bruise/bleed easily.  Psychiatric/Behavioral:  Negative for depression, sleep disturbance and suicidal ideas. The patient is not nervous/anxious.   All other systems reviewed and are negative.    VITALS:   Blood pressure (!) 170/72, pulse (!) 55, temperature 97.8 F (36.6 C), temperature source Oral, resp. rate 19, weight 173 lb 1 oz (78.5 kg), SpO2 100%.  Wt Readings from Last 3 Encounters:  02/25/24 173 lb 1 oz (78.5 kg)  02/03/24 172 lb 2.9 oz (78.1 kg)  01/23/24 167 lb (75.8 kg)    Body mass index is 28.8 kg/m.  Performance status (ECOG): 1 - Symptomatic but completely ambulatory  PHYSICAL EXAM:   Physical Exam Vitals and nursing note reviewed. Exam conducted with a chaperone present.  Constitutional:      Appearance: Normal appearance.   Cardiovascular:     Rate and Rhythm: Normal rate and regular rhythm.     Pulses: Normal pulses.     Heart sounds: Normal heart sounds.  Pulmonary:     Effort: Pulmonary effort is normal.     Breath sounds: Normal breath sounds.  Abdominal:     Palpations: Abdomen is soft. There is no hepatomegaly,  splenomegaly or mass.     Tenderness: There is no abdominal tenderness.   Musculoskeletal:  Right lower leg: No edema.     Left lower leg: No edema.  Lymphadenopathy:     Cervical: No cervical adenopathy.     Right cervical: No superficial, deep or posterior cervical adenopathy.    Left cervical: No superficial, deep or posterior cervical adenopathy.     Upper Body:     Right upper body: No supraclavicular or axillary adenopathy.     Left upper body: No supraclavicular or axillary adenopathy.   Neurological:     General: No focal deficit present.     Mental Status: She is alert and oriented to person, place, and time.   Psychiatric:        Mood and Affect: Mood normal.        Behavior: Behavior normal.     LABS:   CBC    Component Value Date/Time   WBC 5.7 02/03/2024 0444   RBC 3.03 (L) 02/03/2024 0444   HGB 10.8 (L) 02/03/2024 0444   HCT 33.4 (L) 02/03/2024 0444   PLT 170 02/03/2024 0444   MCV 110.2 (H) 02/03/2024 0444   MCH 35.6 (H) 02/03/2024 0444   MCHC 32.3 02/03/2024 0444   RDW 13.9 02/03/2024 0444   LYMPHSABS 1.5 01/10/2024 0850   MONOABS 0.3 01/10/2024 0850   EOSABS 0.1 01/10/2024 0850   BASOSABS 0.0 01/10/2024 0850    CMP    Component Value Date/Time   NA 138 02/03/2024 0444   K 3.4 (L) 02/03/2024 0444   CL 107 02/03/2024 0444   CO2 24 02/03/2024 0444   GLUCOSE 94 02/03/2024 0444   BUN 9 02/03/2024 0444   CREATININE 0.62 02/03/2024 0444   CALCIUM 9.0 02/03/2024 0444   PROT 6.7 01/10/2024 0850   ALBUMIN  3.7 01/10/2024 0850   AST 16 01/10/2024 0850   ALT 13 01/10/2024 0850   ALKPHOS 51 01/10/2024 0850   BILITOT 0.8 01/10/2024 0850   GFRNONAA >60 02/03/2024 0444   GFRAA >60 12/30/2015 1448     Lab Results  Component Value Date   CEA1 1.9 01/10/2024   /  CEA  Date Value Ref Range Status  01/10/2024 1.9 0.0 - 4.7 ng/mL Final    Comment:    (NOTE)                             Nonsmokers          <3.9                             Smokers              <5.6 Roche [employer] Electrochemiluminescence Immunoassay (ECLIA) Values obtained with different assay methods or kits cannot be used interchangeably.  Results cannot be interpreted as absolute evidence of the presence or absence of malignant disease. Performed At: [facility] [address] [place], [place] [id number] Pearlean Botts MD GN:[id number]    No results found for: PSA1 No results found for: CAN199 No results found for: CAN125  No results found for: TOTALPROTELP, ALBUMINELP, A1GS, A2GS, BETS, BETA2SER, GAMS, MSPIKE, SPEI Lab Results  Component Value Date   TIBC 267 01/10/2024   FERRITIN 162 01/10/2024   IRONPCTSAT 46 (H) 01/10/2024   No results found for: LDH   STUDIES:   No results found.

## 2024-02-25 NOTE — Patient Instructions (Signed)
 [facility] at [facility] Discharge Instructions   You were seen and examined today by Dr. Cheree Cords.  He reviewed the results of your lab work which are normal/stable.   We will see you back in 3 weeks.   Return as scheduled.    Thank you for choosing  Cancer Center at [facility] to provide your oncology and hematology care.  To afford each patient quality time with our provider, please arrive at least 15 minutes before your scheduled appointment time.   If you have a lab appointment with the Cancer Center please come in thru the Main Entrance and check in at the main information desk.  You need to re-schedule your appointment should you arrive 10 or more minutes late.  We strive to give you quality time with our providers, and arriving late affects you and other patients whose appointments are after yours.  Also, if you no show three or more times for appointments you may be dismissed from the clinic at the providers discretion.     Again, thank you for choosing [facility].  Our hope is that these requests will decrease the amount of time that you wait before being seen by our physicians.       _____________________________________________________________  Should you have questions after your visit to [facility], please contact our office at [phone] and follow the prompts.  Our office hours are 8:00 a.m. and 4:30 p.m. Monday - Friday.  Please note that voicemails left after 4:00 p.m. may not be returned until the following business day.  We are closed weekends and major holidays.  You do have access to a nurse 24-7, just call the main number to the clinic [phone] and do not press any options, hold on the line and a nurse will answer the phone.    For prescription refill requests, have your pharmacy contact our office and allow 72 hours.    Due to Covid, you will need to wear a mask upon entering the  hospital. If you do not have a mask, a mask will be given to you at the Main Entrance upon arrival. For doctor visits, patients may have 1 support person age 40 or older with them. For treatment visits, patients can not have anyone with them due to social distancing guidelines and our immunocompromised population.

## 2024-03-07 ENCOUNTER — Other Ambulatory Visit (HOSPITAL_COMMUNITY): Payer: Self-pay | Admitting: Radiology

## 2024-03-07 DIAGNOSIS — J449 Chronic obstructive pulmonary disease, unspecified: Secondary | ICD-10-CM

## 2024-03-16 LAB — SIGNATERA
SIGNATERA MTM READOUT: 0 MTM/ml
SIGNATERA TEST RESULT: NEGATIVE

## 2024-03-17 ENCOUNTER — Ambulatory Visit: Admitting: Hematology

## 2024-03-17 ENCOUNTER — Inpatient Hospital Stay: Attending: Hematology | Admitting: Hematology

## 2024-03-17 VITALS — BP 151/79 | HR 66 | Temp 98.5°F | Resp 17 | Ht 65.0 in | Wt 170.0 lb

## 2024-03-17 DIAGNOSIS — D508 Other iron deficiency anemias: Secondary | ICD-10-CM | POA: Diagnosis not present

## 2024-03-17 DIAGNOSIS — Z9049 Acquired absence of other specified parts of digestive tract: Secondary | ICD-10-CM | POA: Diagnosis not present

## 2024-03-17 DIAGNOSIS — Z85038 Personal history of other malignant neoplasm of large intestine: Secondary | ICD-10-CM | POA: Insufficient documentation

## 2024-03-17 DIAGNOSIS — C185 Malignant neoplasm of splenic flexure: Secondary | ICD-10-CM

## 2024-03-17 NOTE — Patient Instructions (Signed)
 [facility] at [facility] Discharge Instructions   You were seen and examined today by Dr. Rogers.  He reviewed the results of your lab work which are normal/stable. The Signatera blood test we did was negative, which means that there are no circulating cancer cells in the blood.   Even though you have Stage II cancer that has been completely removed, the cancer you have has one high risk feature that would make you eligible for treatment with a chemotherapy pill. It is given 2 weeks on with 1 week off for about 6 months. This pills do have side effects. This can also be monitored closely with blood work and scans with no treatment.   We will see you back in 3 months. We will repeat lab work prior to this visit.    Return as scheduled.    Thank you for choosing [facility] at [facility] to provide your oncology and hematology care.  To afford each patient quality time with our provider, please arrive at least 15 minutes before your scheduled appointment time.   If you have a lab appointment with the Cancer Center please come in thru the Main Entrance and check in at the main information desk.  You need to re-schedule your appointment should you arrive 10 or more minutes late.  We strive to give you quality time with our providers, and arriving late affects you and other patients whose appointments are after yours.  Also, if you no show three or more times for appointments you may be dismissed from the clinic at the providers discretion.     Again, thank you for choosing [facility].  Our hope is that these requests will decrease the amount of time that you wait before being seen by our physicians.       _____________________________________________________________  Should you have questions after your visit to [facility], please contact our office at [phone] and follow the prompts.  Our office hours are 8:00  a.m. and 4:30 p.m. Monday - Friday.  Please note that voicemails left after 4:00 p.m. may not be returned until the following business day.  We are closed weekends and major holidays.  You do have access to a nurse 24-7, just call the main number to the clinic [phone] and do not press any options, hold on the line and a nurse will answer the phone.    For prescription refill requests, have your pharmacy contact our office and allow 72 hours.    Due to Covid, you will need to wear a mask upon entering the hospital. If you do not have a mask, a mask will be given to you at the Main Entrance upon arrival. For doctor visits, patients may have 1 support person age 19 or older with them. For treatment visits, patients can not have anyone with them due to social distancing guidelines and our immunocompromised population.

## 2024-03-17 NOTE — Progress Notes (Signed)
 [facility] [address] [place] [address]   Clinic Day:  03/17/2024  Referring physician: Orpha Yancey LABOR, MD  Patient Care Team: Tara Yancey LABOR, MD as PCP - General (Internal Medicine)   ASSESSMENT & PLAN:   Assessment:  1.  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 - Pathology: Tubular adenoma in the cecal, ascending and hepatic flexure polyps.  Descending colon mass biopsy shows at least intramucosal adenocarcinoma. - CT CAP (01/03/2024): Slight wall thickening and stranding along the splenic flexure, proximal descending colon.  Small bubble of air along the wall of the bowel.  No lymphadenopathy or metastatic disease.  Several small subcentimeter lung nodules seen which are similar to lung cancer screening scan from March 2025. - Robotic assisted partial colectomy (splenic flexure) by Dr. Teresa on 02/01/2024 - Pathology: Moderately differentiated invasive adenocarcinoma tumor size 3 cm, no lymphovascular invasion.  Perineural invasion identified.  Margins negative.  0/14 lymph nodes positive.  pT3 pN0.  MMR preserved. - Preoperative CEA: 1.9 - Signatera test: Negative   2. [name] Independent of ADL's and IADL's. Tobacco use of 1 ppd since age 28. No asbestos exposure. No chemical exposures.  -Father had prostate cancer and lung cancer. Brother had liver cancer. Maternal aunt had breast cancer. Paternal uncle had brain brain cancer.   Plan:  1.  Stage II (T3 N0 M0) splenic flexure adenocarcinoma, MMR preserved: - We discussed the Signatera test results which were negative.  We have requested MSI on the pathology.  I could not see the report. - She has 1 high risk feature of positive perineural invasion.  We discussed options for stage II high  risk disease including observation/capecitabine (6 months)/CapeOx (3 months). - She chose the option of observation. - We discussed surveillance plan which includes: Clinic visits every 3 months with CEA, LFTs, imaging and Signatera every 6 months for the first 2 years.  She will have colonoscopy in April 2026.  After the first 2 years, she will have clinic visits every 6 months and imaging once a year.  2.  Mild macrocytic anemia: - Will check ferritin, iron panel, B12 and folic acid  at next visit.   Orders Placed This Encounter  Procedures   CT CHEST ABDOMEN PELVIS W CONTRAST    Standing Status:   Future    Expected Date:   06/17/2024    Expiration Date:   03/17/2025    If indicated for the ordered procedure, I authorize the administration of contrast media per Radiology protocol:   Yes    Does the patient have a contrast media/X-ray dye allergy?:   No    Preferred imaging location?:   [facility]    If indicated for the ordered procedure, I authorize the administration of oral contrast media per Radiology protocol:   Yes   CBC with Differential    Standing Status:   Future    Expected Date:   06/11/2024    Expiration Date:   09/09/2024   Comprehensive metabolic panel    Standing Status:   Future    Expected Date:   06/11/2024    Expiration Date:   09/09/2024   CEA    Standing Status:   Future    Expected Date:   06/11/2024    Expiration Date:   09/09/2024   Ferritin  Standing Status:   Future    Expected Date:   06/11/2024    Expiration Date:   09/09/2024   Iron and TIBC ([facility] ONLY)    Standing Status:   Future    Expected Date:   06/11/2024    Expiration Date:   09/09/2024   Vitamin B12    Standing Status:   Future    Expected Date:   06/11/2024    Expiration Date:   09/09/2024   Folate    Standing Status:   Future    Expected Date:   06/11/2024    Expiration Date:   09/09/2024      [name],acting as a scribe for [name], MD.,have documented all relevant documentation on the behalf of [name], MD,as directed by  [name], MD while in the presence of [name], MD.  I, [name] MD, have reviewed the above documentation for accuracy and completeness, and I agree with the above.     [name], MD   7/7/202511:58 AM  CHIEF COMPLAINT/PURPOSE OF CONSULT:   Diagnosis: Descending colon cancer   Cancer Staging  Cancer of splenic flexure [facility]) Staging form: Colon and Rectum, AJCC 8th Edition - Clinical stage from 02/25/2024: Stage IIA (cT3, cN0, cM0) - Signed by Stands Alean, MD on 02/25/2024    Prior Therapy: None  Current Therapy: Under workup   HISTORY OF PRESENT ILLNESS:   Oncology History  Cancer of splenic flexure (HCC)  02/25/2024 Initial Diagnosis   Cancer of splenic flexure (HCC)   02/25/2024 Cancer Staging   Staging form: Colon and Rectum, AJCC 8th Edition - Clinical stage from 02/25/2024: Stage IIA (cT3, cN0, cM0) - Signed by Stands Alean, MD on 02/25/2024 Histopathologic type: Adenocarcinoma, NOS Stage prefix: Initial diagnosis Total positive nodes: 0 Total nodes examined: 14 Histologic grade (G): G2 Histologic grading system: 4 grade system Laterality: Left Tumor size (mm): 30 Lymph-vascular invasion (LVI): LVI not present (absent)/not identified Specimen type: Excision Carcinoembryonic antigen (CEA) (ng/mL): 1.9 Perineural invasion (PNI): Present Microsatellite instability (MSI): Stable KRAS mutation: Unknown NRAS mutation: Unknown BRAF mutation: Unknown       Tara Morgan is a 63 y.o. female presenting to clinic today for evaluation of descending colon cancer at the request of Tara Deatrice FALCON, MD.  Patient underwent colonoscopy on 01/02/24 with Dr. Cinderella, which found a fungating, polypoid and ulcerated non-obstructing large mass in the descending colon, 50 cm from anal verge. The mass was partially  circumferential and was not bleeding. Nine sessile polyps, 3 to 10 mm in size, were found in the sigmoid colon, transverse colon, ascending colon, and cecum. A 20 mm sessile polyp was found in the ascending colon. A 25 mm sessile polyp was found in the transverse colon. A 15 mm sessile polyp was found in the transverse colon. Non-bleeding internal hemorrhoids were also found.  Pathology of the descending colon mass revealed: at least intramucosal adenocarcinoma, though depth of invasion cannot be judged due to superficial nature of the biopsies. The polypectomies showed: tubular adenomas, negative for high-grade dysplasia or malignancy.   Haili had CT CAP on 01/03/24 that found: Slight wall thickening and stranding along the splenic flexure, proximal descending colon.There is a small bubble of air along the wall of the bowel in this location medially. Favor this being a diverticula with air rather than a bubble of free air. No signs of obstruction. No free-fluid. No widespread free air. No developing lymph node enlargement. Tiny hepatic cystic focus. No  separate space-occupying liver lesion.  Today, she states that she is doing well overall. Her appetite level is at 75%. Her energy level is at 0%. She is accompanied by family members. Seriah denies any pain after colonoscopy. She has an appointment with surgery, Dr. Teresa, in [place] on 01/14/24. She denies any BRBPR or melena prior to colonoscopy. She denies any unintentional weight loss or new onset pains, including abdominal pain, in the last 6 months. No history of MI's, CVA's, or TIA's.   Marytza reports normal blood pressure at home and states she takes blood pressure medication. She had her PCP changed to Dr. Darroll a few months ago.   INTERVAL HISTORY:   Tara Morgan is a 63 y.o. female presenting to the clinic today for follow-up of colon cancer. She was last seen by me on 02/25/24.  Today, she states that she is doing well overall. Her appetite  level is at 100%. Her energy level is at 80%.   PAST MEDICAL HISTORY:   Past Medical History: Past Medical History:  Diagnosis Date   Cancer (HCC)    colon   COPD (chronic obstructive pulmonary disease) (HCC)    Hypertension     Surgical History: Past Surgical History:  Procedure Laterality Date   BREAST BIOPSY Right 04/02/2017   FIBROADENFIBROCYSTIC CHANGES AND ADENOSIS WITH CALCIFICATIONS   CATARACT EXTRACTION W/PHACO Left 01/03/2016   Procedure: CATARACT EXTRACTION PHACO AND INTRAOCULAR LENS PLACEMENT (IOC);  Surgeon: Cherene Mania, MD;  Location: [facility];  Service: Ophthalmology;  Laterality: Left;  CDE:5.86   CATARACT EXTRACTION W/PHACO Right 02/21/2016   Procedure: CATARACT EXTRACTION PHACO AND INTRAOCULAR LENS PLACEMENT RIGHT EYE; CDE:  6.40;  Surgeon: Cherene Mania, MD;  Location: [facility];  Service: Ophthalmology;  Laterality: Right;   CHOLECYSTECTOMY     COLONOSCOPY N/A 01/02/2024   Procedure: COLONOSCOPY;  Surgeon: Tara Deatrice FALCON, MD;  Location: [facility];  Service: Endoscopy;  Laterality: N/A;  1:30PM;ASA 1-2   JOINT REPLACEMENT Left 2022   hip replacement    Social History: Social History   Socioeconomic History   Marital status: Widowed    Spouse name: Not on file   Number of children: Not on file   Years of education: Not on file   Highest education level: Not on file  Occupational History   Not on file  Tobacco Use   Smoking status: Former    Current packs/day: 1.00    Average packs/day: 1 pack/day for 45.0 years (45.0 ttl pk-yrs)    Types: Cigarettes   Smokeless tobacco: Never   Tobacco comments:    Quitt 1 week ago  Vaping Use   Vaping status: Former  Substance and Sexual Activity   Alcohol use: Yes    Comment: 2 times week   Drug use: No   Sexual activity: Yes    Birth control/protection: None  Other Topics Concern   Not on file  Social History Narrative   Not on file   Social Drivers of Health   Financial Resource Strain: Not on file   Food Insecurity: No Food Insecurity (02/01/2024)   Hunger Vital Sign    Worried About Running Out of Food in the Last Year: Never true    Ran Out of Food in the Last Year: Never true  Transportation Needs: No Transportation Needs (02/01/2024)   PRAPARE - Administrator, Civil Service (Medical): No    Lack of Transportation (Non-Medical): No  Physical Activity: Not on file  Stress:  Not on file  Social Connections: Not on file  Intimate Partner Violence: Not At Risk (02/01/2024)   Humiliation, Afraid, Rape, and Kick questionnaire    Fear of Current or Ex-Partner: No    Emotionally Abused: No    Physically Abused: No    Sexually Abused: No    Family History: Family History  Problem Relation Age of Onset   Dementia Mother    Heart failure Father     Current Medications:  Current Outpatient Medications:    acetaminophen  (TYLENOL ) 500 MG tablet, Take 1,000 mg by mouth every 6 (six) hours as needed for moderate pain (pain score 4-6)., Disp: , Rfl:    albuterol  (VENTOLIN  HFA) 108 (90 Base) MCG/ACT inhaler, Inhale 1 puff into the lungs every 6 (six) hours as needed for shortness of breath., Disp: , Rfl:    Cyanocobalamin  (B-12 PO), Take 1 capsule by mouth daily., Disp: , Rfl:    fluticasone  furoate-vilanterol (BREO ELLIPTA ) 100-25 MCG/ACT AEPB, Inhale 1 puff into the lungs daily as needed (shortness of breath)., Disp: , Rfl:    gabapentin  (NEURONTIN ) 100 MG capsule, Take 100 mg by mouth 3 (three) times daily as needed (pain)., Disp: , Rfl:    hydrALAZINE  (APRESOLINE ) 25 MG tablet, Take 25 mg by mouth 2 (two) times daily., Disp: , Rfl:    nicotine  (NICODERM CQ  - DOSED IN MG/24 HOURS) 21 mg/24hr patch, Place 21 mg onto the skin daily., Disp: , Rfl:    olmesartan (BENICAR) 40 MG tablet, Take 40 mg by mouth daily., Disp: , Rfl:    sodium bicarbonate  650 MG tablet, Take 650 mg by mouth daily., Disp: , Rfl:    VITAMIN D PO, Take 1 capsule by mouth daily., Disp: , Rfl:     Allergies: No Known Allergies  REVIEW OF SYSTEMS:   Review of Systems  Constitutional:  Negative for chills, fatigue and fever.  HENT:   Negative for lump/mass, mouth sores, nosebleeds, sore throat and trouble swallowing.   Eyes:  Negative for eye problems.  Respiratory:  Negative for cough and shortness of breath.   Cardiovascular:  Negative for chest pain, leg swelling and palpitations.  Gastrointestinal:  Negative for abdominal pain, constipation, diarrhea, nausea and vomiting.  Genitourinary:  Negative for bladder incontinence, difficulty urinating, dysuria, frequency, hematuria and nocturia.   Musculoskeletal:  Negative for arthralgias, back pain, flank pain, myalgias and neck pain.  Skin:  Negative for itching and rash.  Neurological:  Negative for dizziness, headaches and numbness.  Hematological:  Does not bruise/bleed easily.  Psychiatric/Behavioral:  Negative for depression, sleep disturbance and suicidal ideas. The patient is not nervous/anxious.   All other systems reviewed and are negative.    VITALS:   Blood pressure (!) 151/79, pulse 66, temperature 98.5 F (36.9 C), temperature source Tympanic, resp. rate 17, height 5' 5 (1.651 m), weight 170 lb (77.1 kg), SpO2 100%.  Wt Readings from Last 3 Encounters:  03/17/24 170 lb (77.1 kg)  02/25/24 173 lb 1 oz (78.5 kg)  02/03/24 172 lb 2.9 oz (78.1 kg)    Body mass index is 28.29 kg/m.  Performance status (ECOG): 1 - Symptomatic but completely ambulatory  PHYSICAL EXAM:   Physical Exam Vitals and nursing note reviewed. Exam conducted with a chaperone present.  Constitutional:      Appearance: Normal appearance.  Cardiovascular:     Rate and Rhythm: Normal rate and regular rhythm.     Pulses: Normal pulses.     Heart sounds: Normal heart  sounds.  Pulmonary:     Effort: Pulmonary effort is normal.     Breath sounds: Normal breath sounds.  Abdominal:     Palpations: Abdomen is soft. There is no hepatomegaly,  splenomegaly or mass.     Tenderness: There is no abdominal tenderness.  Musculoskeletal:     Right lower leg: No edema.     Left lower leg: No edema.  Lymphadenopathy:     Cervical: No cervical adenopathy.     Right cervical: No superficial, deep or posterior cervical adenopathy.    Left cervical: No superficial, deep or posterior cervical adenopathy.     Upper Body:     Right upper body: No supraclavicular or axillary adenopathy.     Left upper body: No supraclavicular or axillary adenopathy.  Neurological:     General: No focal deficit present.     Mental Status: She is alert and oriented to person, place, and time.  Psychiatric:        Mood and Affect: Mood normal.        Behavior: Behavior normal.     LABS:   CBC    Component Value Date/Time   WBC 5.7 02/03/2024 0444   RBC 3.03 (L) 02/03/2024 0444   HGB 10.8 (L) 02/03/2024 0444   HCT 33.4 (L) 02/03/2024 0444   PLT 170 02/03/2024 0444   MCV 110.2 (H) 02/03/2024 0444   MCH 35.6 (H) 02/03/2024 0444   MCHC 32.3 02/03/2024 0444   RDW 13.9 02/03/2024 0444   LYMPHSABS 1.5 01/10/2024 0850   MONOABS 0.3 01/10/2024 0850   EOSABS 0.1 01/10/2024 0850   BASOSABS 0.0 01/10/2024 0850    CMP    Component Value Date/Time   NA 138 02/03/2024 0444   K 3.4 (L) 02/03/2024 0444   CL 107 02/03/2024 0444   CO2 24 02/03/2024 0444   GLUCOSE 94 02/03/2024 0444   BUN 9 02/03/2024 0444   CREATININE 0.62 02/03/2024 0444   CALCIUM 9.0 02/03/2024 0444   PROT 6.7 01/10/2024 0850   ALBUMIN  3.7 01/10/2024 0850   AST 16 01/10/2024 0850   ALT 13 01/10/2024 0850   ALKPHOS 51 01/10/2024 0850   BILITOT 0.8 01/10/2024 0850   GFRNONAA >60 02/03/2024 0444   GFRAA >60 12/30/2015 1448     Lab Results  Component Value Date   CEA1 1.9 01/10/2024   /  CEA  Date Value Ref Range Status  01/10/2024 1.9 0.0 - 4.7 ng/mL Final    Comment:    (NOTE)                             Nonsmokers          <3.9                             Smokers              <5.6 Roche [employer] Electrochemiluminescence Immunoassay (ECLIA) Values obtained with different assay methods or kits cannot be used interchangeably.  Results cannot be interpreted as absolute evidence of the presence or absence of malignant disease. Performed At: [facility] [address] [place], [place] [id number] Jennette Shorter MD Ey:[phone]    No results found for: PSA1 No results found for: CAN199 No results found for: CAN125  No results found for: TOTALPROTELP, ALBUMINELP, A1GS, A2GS, BETS, BETA2SER, GAMS, MSPIKE, SPEI Lab Results  Component Value Date  TIBC 267 01/10/2024   FERRITIN 162 01/10/2024   IRONPCTSAT 46 (H) 01/10/2024   No results found for: LDH   STUDIES:   No results found.

## 2024-03-18 ENCOUNTER — Other Ambulatory Visit (HOSPITAL_COMMUNITY): Payer: Self-pay | Admitting: Internal Medicine

## 2024-03-18 ENCOUNTER — Inpatient Hospital Stay

## 2024-03-18 ENCOUNTER — Encounter: Payer: Self-pay | Admitting: Genetic Counselor

## 2024-03-18 ENCOUNTER — Inpatient Hospital Stay: Admitting: Genetic Counselor

## 2024-03-18 DIAGNOSIS — Z8042 Family history of malignant neoplasm of prostate: Secondary | ICD-10-CM | POA: Diagnosis not present

## 2024-03-18 DIAGNOSIS — Z808 Family history of malignant neoplasm of other organs or systems: Secondary | ICD-10-CM

## 2024-03-18 DIAGNOSIS — Z803 Family history of malignant neoplasm of breast: Secondary | ICD-10-CM

## 2024-03-18 DIAGNOSIS — M810 Age-related osteoporosis without current pathological fracture: Secondary | ICD-10-CM

## 2024-03-18 DIAGNOSIS — C185 Malignant neoplasm of splenic flexure: Secondary | ICD-10-CM | POA: Diagnosis not present

## 2024-03-18 DIAGNOSIS — Z800 Family history of malignant neoplasm of digestive organs: Secondary | ICD-10-CM | POA: Diagnosis not present

## 2024-03-18 NOTE — Progress Notes (Signed)
 REFERRING PROVIDER: Teresa Lonni HERO, MD [address] [place],  [place] [address]  PRIMARY PROVIDER:  Orpha Yancey LABOR, MD  PRIMARY REASON FOR VISIT:  1. Cancer of splenic flexure (HCC)   2. Family history of breast cancer   3. Family history of colon cancer   4. Family history of prostate cancer   5. Family history of brain cancer    HISTORY OF PRESENT ILLNESS:   Tara Morgan, a 63 y.o. female, was seen for a [facility] cancer genetics consultation at the request of Dr. Teresa due to a personal and family history of cancer.  Tara Morgan presents to clinic today to discuss the possibility of a hereditary predisposition to cancer, to discuss genetic testing, and to further clarify her future cancer risks, as well as potential cancer risks for family members.   In May 2025, at the age of 59, Tara Morgan was diagnosed with colon cancer (normal IHC). She had 12 polyps removed on the colonoscopy and several were determined to be tubular adenomas.  CANCER HISTORY:  Oncology History  Cancer of splenic flexure (HCC)  02/25/2024 Initial Diagnosis   Cancer of splenic flexure (HCC)   02/25/2024 Cancer Staging   Staging form: Colon and Rectum, AJCC 8th Edition - Clinical stage from 02/25/2024: Stage IIA (cT3, cN0, cM0) - Signed by Rogers Hai, MD on 02/25/2024 Histopathologic type: Adenocarcinoma, NOS Stage prefix: Initial diagnosis Total positive nodes: 0 Total nodes examined: 14 Histologic grade (G): G2 Histologic grading system: 4 grade system Laterality: Left Tumor size (mm): 30 Lymph-vascular invasion (LVI): LVI not present (absent)/not identified Specimen type: Excision Carcinoembryonic antigen (CEA) (ng/mL): 1.9 Perineural invasion (PNI): Present Microsatellite instability (MSI): Stable KRAS mutation: Unknown NRAS mutation: Unknown BRAF mutation: Unknown      RISK FACTORS:  Menarche was at age 57-13.  First live birth at age 80.  OCP use for  approximately 0 years.  Ovaries intact: yes.  Uterus intact: yes.  Menopausal status: postmenopausal.  HRT use: 0 years. Colonoscopy: yes Mammogram within the last year: yes. Number of breast biopsies: 1 in 2018- fibroadenomatoid nodule Any excessive radiation exposure in the past: no  Past Medical History:  Diagnosis Date   Cancer (HCC)    colon   COPD (chronic obstructive pulmonary disease) (HCC)    Hypertension     Past Surgical History:  Procedure Laterality Date   BREAST BIOPSY Right 04/02/2017   FIBROADENFIBROCYSTIC CHANGES AND ADENOSIS WITH CALCIFICATIONS   CATARACT EXTRACTION W/PHACO Left 01/03/2016   Procedure: CATARACT EXTRACTION PHACO AND INTRAOCULAR LENS PLACEMENT (IOC);  Surgeon: Cherene Mania, MD;  Location: [facility];  Service: Ophthalmology;  Laterality: Left;  CDE:5.86   CATARACT EXTRACTION W/PHACO Right 02/21/2016   Procedure: CATARACT EXTRACTION PHACO AND INTRAOCULAR LENS PLACEMENT RIGHT EYE; CDE:  6.40;  Surgeon: Cherene Mania, MD;  Location: [facility];  Service: Ophthalmology;  Laterality: Right;   CHOLECYSTECTOMY     COLONOSCOPY N/A 01/02/2024   Procedure: COLONOSCOPY;  Surgeon: Cinderella Deatrice FALCON, MD;  Location: [facility];  Service: Endoscopy;  Laterality: N/A;  1:30PM;ASA 1-2   JOINT REPLACEMENT Left 2022   hip replacement    Social History   Socioeconomic History   Marital status: Widowed    Spouse name: Not on file   Number of children: Not on file   Years of education: Not on file   Highest education level: Not on file  Occupational History   Not on file  Tobacco Use   Smoking status:  Former    Current packs/day: 1.00    Average packs/day: 1 pack/day for 45.0 years (45.0 ttl pk-yrs)    Types: Cigarettes   Smokeless tobacco: Never   Tobacco comments:    Quitt 1 week ago  Vaping Use   Vaping status: Former  Substance and Sexual Activity   Alcohol use: Yes    Comment: 2 times week   Drug use: No   Sexual activity: Yes    Birth  control/protection: None  Other Topics Concern   Not on file  Social History Narrative   Not on file   Social Drivers of Health   Financial Resource Strain: Not on file  Food Insecurity: No Food Insecurity (02/01/2024)   Hunger Vital Sign    Worried About Running Out of Food in the Last Year: Never true    Ran Out of Food in the Last Year: Never true  Transportation Needs: No Transportation Needs (02/01/2024)   PRAPARE - Administrator, Civil Service (Medical): No    Lack of Transportation (Non-Medical): No  Physical Activity: Not on file  Stress: Not on file  Social Connections: Not on file     FAMILY HISTORY:  We obtained a detailed, 4-generation family history.  Significant diagnoses are listed below: Family History  Problem Relation Age of Onset   Dementia Mother    Prostate cancer Mother 86 - 57   Lung cancer Mother 59 - 83       separate primaries   Heart failure Father    Liver cancer Brother 26   Breast cancer Maternal Aunt        dx. >50   Colon cancer Maternal Uncle        dx. >50   Breast cancer Paternal Aunt 39 - 39   Brain cancer Paternal Uncle 28 - 79   Lung cancer Maternal Grandmother        smoked   Lung cancer Paternal Grandfather    Brain cancer Paternal Cousin      Tara Morgan is unaware of previous family history of genetic testing for hereditary cancer risks. There is no reported Ashkenazi Jewish ancestry.   GENETIC COUNSELING ASSESSMENT:  Tara Morgan is a 63 y.o. female with a personal and family history of cancer which is somewhat suggestive of a hereditary predisposition to cancer given her history of cancer and colon polyps. We, therefore, discussed and recommended the following at today's visit.   DISCUSSION:  Approximately 5 - 10% of cancer/polyposis is hereditary, with most cases of colon cancer associated with Lynch Syndrome and most cases of adenomatous polyposis associated with APC and MUTYH.  There are other genes that can be  associated with hereditary polyposis/cancer syndromes.  We discussed that testing is beneficial for several reasons including knowing how to follow individuals after completing their treatment and understanding if other family members could be at risk for cancer and allowing them to undergo genetic testing.   We reviewed the characteristics, features and inheritance patterns of hereditary cancer syndromes. We also discussed genetic testing, including the appropriate family members to test, the process of testing, insurance coverage and turn-around-time for results. We discussed the implications of a negative, positive, carrier and/or variant of uncertain significant result. We recommended Tara Morgan pursue genetic testing for a panel that includes genes associated with cancer and polyposis. Based on Tara Morgan's personal and family history of cancer, she meets medical criteria for genetic testing. Despite that she meets criteria, she may still  have an out of pocket cost.   PLAN:  Despite our recommendation, Tara Morgan did not wish to pursue genetic testing at today's visit. We understand this decision and remain available to coordinate genetic testing at any time in the future. We, therefore, recommend Tara Morgan continue to follow the cancer screening guidelines given by her primary healthcare provider.  Tara Morgan questions were answered to her satisfaction today. Our contact information was provided should additional questions or concerns arise. Thank you for the referral and allowing us  to share in the care of your patient.   Elea Holtzclaw, MS, [facility] Genetic Counselor [email or web address] (P) [phone]  60 minutes were spent on the date of the encounter in service to the patient including preparation, face-to-face consultation, documentation and care coordination.  The patient brought her sister. Drs. Gudena and/or Lanny were available to discuss this case as needed.    _______________________________________________________________________ For Office Staff:  Number of people involved in session: 2 Was an Intern/ student involved with case: no

## 2024-03-20 ENCOUNTER — Other Ambulatory Visit (HOSPITAL_COMMUNITY): Payer: Self-pay | Admitting: Internal Medicine

## 2024-03-20 ENCOUNTER — Encounter (HOSPITAL_COMMUNITY): Payer: Self-pay

## 2024-03-20 DIAGNOSIS — N63 Unspecified lump in unspecified breast: Secondary | ICD-10-CM

## 2024-03-26 ENCOUNTER — Encounter (HOSPITAL_COMMUNITY): Payer: Self-pay | Admitting: Student in an Organized Health Care Education/Training Program

## 2024-04-10 ENCOUNTER — Ambulatory Visit (HOSPITAL_COMMUNITY)
Admission: RE | Admit: 2024-04-10 | Discharge: 2024-04-10 | Disposition: A | Discharge status: Home / Self Care | Source: Ambulatory Visit | Attending: Internal Medicine | Admitting: Internal Medicine

## 2024-04-10 ENCOUNTER — Encounter (HOSPITAL_COMMUNITY): Payer: Self-pay

## 2024-04-10 DIAGNOSIS — R928 Other abnormal and inconclusive findings on diagnostic imaging of breast: Secondary | ICD-10-CM | POA: Diagnosis not present

## 2024-04-10 DIAGNOSIS — N63 Unspecified lump in unspecified breast: Secondary | ICD-10-CM

## 2024-04-10 DIAGNOSIS — M810 Age-related osteoporosis without current pathological fracture: Secondary | ICD-10-CM | POA: Diagnosis not present

## 2024-04-10 DIAGNOSIS — R921 Mammographic calcification found on diagnostic imaging of breast: Secondary | ICD-10-CM | POA: Diagnosis not present

## 2024-04-10 DIAGNOSIS — R92323 Mammographic fibroglandular density, bilateral breasts: Secondary | ICD-10-CM | POA: Diagnosis not present

## 2024-04-10 DIAGNOSIS — N6314 Unspecified lump in the right breast, lower inner quadrant: Secondary | ICD-10-CM | POA: Diagnosis not present

## 2024-04-15 ENCOUNTER — Ambulatory Visit (HOSPITAL_COMMUNITY)
Admission: RE | Admit: 2024-04-15 | Discharge: 2024-04-15 | Disposition: A | Discharge status: Home / Self Care | Source: Ambulatory Visit | Attending: Internal Medicine | Admitting: Internal Medicine

## 2024-04-15 DIAGNOSIS — J449 Chronic obstructive pulmonary disease, unspecified: Secondary | ICD-10-CM | POA: Diagnosis not present

## 2024-04-15 LAB — PULMONARY FUNCTION TEST
DL/VA % pred: 93 %
DL/VA: 3.88 ml/min/mmHg/L
DLCO unc % pred: 98 %
DLCO unc: 20.14 ml/min/mmHg
FEF 25-75 Post: 3.37 L/s
FEF 25-75 Pre: 3.16 L/s
FEF2575-%Change-Post: 6 %
FEF2575-%Pred-Post: 150 %
FEF2575-%Pred-Pre: 141 %
FEV1-%Change-Post: 0 %
FEV1-%Pred-Post: 121 %
FEV1-%Pred-Pre: 121 %
FEV1-Post: 3.09 L
FEV1-Pre: 3.09 L
FEV1FVC-%Change-Post: 3 %
FEV1FVC-%Pred-Pre: 103 %
FEV6-%Change-Post: -3 %
FEV6-%Pred-Post: 116 %
FEV6-%Pred-Pre: 120 %
FEV6-Post: 3.71 L
FEV6-Pre: 3.83 L
FEV6FVC-%Change-Post: 0 %
FEV6FVC-%Pred-Post: 103 %
FEV6FVC-%Pred-Pre: 103 %
FVC-%Change-Post: -3 %
FVC-%Pred-Post: 112 %
FVC-%Pred-Pre: 116 %
FVC-Post: 3.72 L
FVC-Pre: 3.85 L
Post FEV1/FVC ratio: 83 %
Post FEV6/FVC ratio: 100 %
Pre FEV1/FVC ratio: 80 %
Pre FEV6/FVC Ratio: 99 %
RV % pred: 112 %
RV: 2.39 L
TLC % pred: 117 %
TLC: 6.14 L

## 2024-04-15 MED ORDER — ALBUTEROL SULFATE (2.5 MG/3ML) 0.083% IN NEBU
2.5000 mg | INHALATION_SOLUTION | Freq: Once | RESPIRATORY_TRACT | Status: AC
  Administered 2024-04-15: 2.5 mg via RESPIRATORY_TRACT

## 2024-05-22 ENCOUNTER — Encounter (INDEPENDENT_AMBULATORY_CARE_PROVIDER_SITE_OTHER): Payer: Self-pay | Admitting: *Deleted

## 2024-06-10 ENCOUNTER — Ambulatory Visit (HOSPITAL_COMMUNITY)
Admission: RE | Admit: 2024-06-10 | Discharge: 2024-06-10 | Disposition: A | Source: Ambulatory Visit | Attending: Hematology | Admitting: Hematology

## 2024-06-10 ENCOUNTER — Inpatient Hospital Stay: Payer: Self-pay | Attending: Hematology

## 2024-06-10 ENCOUNTER — Other Ambulatory Visit

## 2024-06-10 DIAGNOSIS — R918 Other nonspecific abnormal finding of lung field: Secondary | ICD-10-CM | POA: Diagnosis not present

## 2024-06-10 DIAGNOSIS — C185 Malignant neoplasm of splenic flexure: Secondary | ICD-10-CM

## 2024-06-10 DIAGNOSIS — Z85038 Personal history of other malignant neoplasm of large intestine: Secondary | ICD-10-CM | POA: Diagnosis not present

## 2024-06-10 DIAGNOSIS — C189 Malignant neoplasm of colon, unspecified: Secondary | ICD-10-CM | POA: Diagnosis not present

## 2024-06-10 DIAGNOSIS — D508 Other iron deficiency anemias: Secondary | ICD-10-CM | POA: Insufficient documentation

## 2024-06-10 DIAGNOSIS — I700 Atherosclerosis of aorta: Secondary | ICD-10-CM | POA: Diagnosis not present

## 2024-06-10 DIAGNOSIS — I2510 Atherosclerotic heart disease of native coronary artery without angina pectoris: Secondary | ICD-10-CM | POA: Diagnosis not present

## 2024-06-10 LAB — COMPREHENSIVE METABOLIC PANEL WITH GFR
ALT: 10 U/L (ref 0–44)
AST: 18 U/L (ref 15–41)
Albumin: 4.4 g/dL (ref 3.5–5.0)
Alkaline Phosphatase: 64 U/L (ref 38–126)
Anion gap: 11 (ref 5–15)
BUN: 10 mg/dL (ref 8–23)
CO2: 22 mmol/L (ref 22–32)
Calcium: 9.8 mg/dL (ref 8.9–10.3)
Chloride: 108 mmol/L (ref 98–111)
Creatinine, Ser: 0.77 mg/dL (ref 0.44–1.00)
GFR, Estimated: >60 mL/min (ref >60)
Glucose, Bld: 106 mg/dL — ABNORMAL HIGH (ref 70–99)
Potassium: 3.7 mmol/L (ref 3.5–5.1)
Sodium: 141 mmol/L (ref 135–145)
Total Bilirubin: 0.6 mg/dL (ref 0.0–1.2)
Total Protein: 7.3 g/dL (ref 6.5–8.1)

## 2024-06-10 LAB — IRON AND TIBC
Iron: 154 ug/dL (ref 28–170)
Saturation Ratios: 63 % — ABNORMAL HIGH (ref 10.4–31.8)
TIBC: 244 ug/dL — ABNORMAL LOW (ref 250–450)
UIBC: 90 ug/dL

## 2024-06-10 LAB — CBC WITH DIFFERENTIAL/PLATELET
Abs Immature Granulocytes: 0.01 K/uL (ref 0.00–0.07)
Basophils Absolute: 0 K/uL (ref 0.0–0.1)
Basophils Relative: 1 %
Eosinophils Absolute: 0 K/uL (ref 0.0–0.5)
Eosinophils Relative: 1 %
HCT: 40.7 % (ref 36.0–46.0)
Hemoglobin: 13.8 g/dL (ref 12.0–15.0)
Immature Granulocytes: 0 %
Lymphocytes Relative: 41 %
Lymphs Abs: 1.3 K/uL (ref 0.7–4.0)
MCH: 35.8 pg — ABNORMAL HIGH (ref 26.0–34.0)
MCHC: 33.9 g/dL (ref 30.0–36.0)
MCV: 105.7 fL — ABNORMAL HIGH (ref 80.0–100.0)
Monocytes Absolute: 0.3 K/uL (ref 0.1–1.0)
Monocytes Relative: 10 %
Neutro Abs: 1.5 K/uL — ABNORMAL LOW (ref 1.7–7.7)
Neutrophils Relative %: 47 %
Platelets: 211 K/uL (ref 150–400)
RBC: 3.85 MIL/uL — ABNORMAL LOW (ref 3.87–5.11)
RDW: 14 % (ref 11.5–15.5)
WBC: 3.1 K/uL — ABNORMAL LOW (ref 4.0–10.5)
nRBC: 0 % (ref 0.0–0.2)

## 2024-06-10 LAB — FOLATE: Folate: 15.9 ng/mL (ref >5.9)

## 2024-06-10 LAB — FERRITIN: Ferritin: 251 ng/mL (ref 11–307)

## 2024-06-10 LAB — VITAMIN B12: Vitamin B-12: 245 pg/mL (ref 180–914)

## 2024-06-10 MED ORDER — IOHEXOL 9 MG/ML PO SOLN
ORAL | Status: AC
  Filled 2024-06-10: qty 1000

## 2024-06-10 MED ORDER — IOHEXOL 300 MG/ML  SOLN
100.0000 mL | Freq: Once | INTRAMUSCULAR | Status: AC | PRN
  Administered 2024-06-10: 100 mL via INTRAVENOUS

## 2024-06-11 LAB — CEA: CEA: 1.9 ng/mL (ref 0.0–4.7)

## 2024-06-17 ENCOUNTER — Inpatient Hospital Stay: Payer: Self-pay | Attending: Hematology | Admitting: Oncology

## 2024-06-17 ENCOUNTER — Inpatient Hospital Stay

## 2024-06-17 VITALS — BP 164/83 | HR 55 | Temp 96.6°F | Resp 20 | Wt 170.6 lb

## 2024-06-17 DIAGNOSIS — C185 Malignant neoplasm of splenic flexure: Secondary | ICD-10-CM | POA: Diagnosis not present

## 2024-06-17 DIAGNOSIS — D508 Other iron deficiency anemias: Secondary | ICD-10-CM | POA: Diagnosis not present

## 2024-06-17 DIAGNOSIS — D649 Anemia, unspecified: Secondary | ICD-10-CM | POA: Insufficient documentation

## 2024-06-17 DIAGNOSIS — E538 Deficiency of other specified B group vitamins: Secondary | ICD-10-CM | POA: Diagnosis not present

## 2024-06-17 MED ORDER — CYANOCOBALAMIN 1000 MCG/ML IJ SOLN
1000.0000 ug | Freq: Once | INTRAMUSCULAR | Status: AC
  Administered 2024-06-17: 1000 ug via INTRAMUSCULAR
  Filled 2024-06-17: qty 1

## 2024-06-17 MED ORDER — CYANOCOBALAMIN 1000 MCG/ML IJ SOLN: 1000.0000 ug | INTRAMUSCULAR | 0 refills | Status: AC

## 2024-06-17 NOTE — Progress Notes (Signed)
 [facility] OFFICE PROGRESS NOTE  Orpha Yancey LABOR, MD  ASSESSMENT & PLAN:    Assessment & Plan Cancer of splenic flexure (HCC) - We discussed the Signatera test results which were negative.  We have requested MSI on the pathology.  I could not see the report. - She has 1 high risk feature of positive perineural invasion.  We discussed options for stage II high risk disease including observation/capecitabine (6 months)/CapeOx (3 months). - She chose the option of observation. -Most recent Signatera testing from 03/16/2024 was negative.  Repeat in 6 months. -Labs from 06/10/2024 show unremarkable CBC, CMP and CEA. -CT scan from 06/10/2024 did not reveal evidence of lymphadenopathy or metastatic disease in the chest abdomen or pelvis.  Stable small pulmonary nodules. - We discussed surveillance plan which includes: Clinic visits every 3 months with CEA, LFTs, imaging and Signatera every 6 months for the first 2 years.  She will have colonoscopy in April 2026.  After the first 2 years, she will have clinic visits every 6 months and imaging once a year. Other iron deficiency anemia Most recent labs show no anemia.  Iron  saturation elevated at 63% low TIBC.  Hemoglobin normal.  Ferritin 251.  Do not take supplements.  Recheck iron levels in 3 months.  Vitamin B12 deficiency disease B12 levels are low.  She was previously taking B12 supplements OTC. We discussed starting B12 injections.  She would like to give these to herself at home. She will be given 1 in clinic and taught how to inject.  Prescription will also be sent to the pharmacy. Recommend weekly x 4 followed by monthly B12 injections.  Orders Placed This Encounter  Procedures   CT CHEST ABDOMEN PELVIS W CONTRAST    Standing Status:   Future    Expected Date:   12/16/2024    Expiration Date:   06/17/2025    If indicated for the ordered procedure, I authorize the administration of contrast media per Radiology protocol:    Yes    Does the patient have a contrast media/X-ray dye allergy?:   No    Preferred imaging location?:   [facility]    If indicated for the ordered procedure, I authorize the administration of oral contrast media per Radiology protocol:   Yes   CBC with Differential    Standing Status:   Future    Expected Date:   09/17/2024    Expiration Date:   12/16/2024   Comprehensive metabolic panel    Standing Status:   Future    Expected Date:   09/17/2024    Expiration Date:   12/16/2024   CEA    Standing Status:   Future    Expected Date:   09/17/2024    Expiration Date:   12/16/2024   Signatera    Select as applicable. If patient is on or planning to receive immunotherapy, select drug: Not on Immunotherapy If Other or Multiple, Write down drug name:  Do not Delete Below This Line   ==========Department Information========== ID: [id number] Department:[facility] [place] - A DEPT OF [facility] [address]  [place] [address] Dept: [phone] Dept Fax: [phone]    Standing Status:   Future    Expected Date:   09/17/2024    Expiration Date:   12/16/2024    Jennell to follow up with patient for sample collection (mobile phleb, lab, or saliva)::   No  Surveillance Program draw frequency::   Every 3 Months    Surveillance Program draw count::   4    Cancer type::   Colon    Stage of diagnosis::   II    History of recurrence?:   No    Current disease status::   No evidence of disease    Patient status::   Outpatient    By placing this electronic order I confirm the testing ordered herein is medically necessary and this patient has been informed of the details of the genetic test(s) ordered, including the risks, benefits, and alternatives, and has consented to testing.:   Yes    What type of billing?:   Bill Insurance    INTERVAL HISTORY: Patient returns for follow-up for splenic flexure cancer.  She is here to review  most recent CT scan and lab results.  Patient denies any hospitalizations, surgeries or changes to baseline health.  Reports overall doing well since her last visit.  States her appetite is 75% energy levels are 25%.  Denies any pain.  She had 2 episodes of diarrhea today.  Has a rash on her arms chest and leg and reports it is very itchy.  Easily bruises but no bleeding, melena or hematochezia.  We reviewed CT scan, CMP, CBC, ferritin, iron panel, B12, folate level.  SUMMARY OF HEMATOLOGIC HISTORY: Oncology History Overview Note  1.  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 - Pathology: Tubular adenoma in the cecal, ascending and hepatic flexure polyps.  Descending colon mass biopsy shows at least intramucosal adenocarcinoma. - CT CAP (01/03/2024): Slight wall thickening and stranding along the splenic flexure, proximal descending colon.  Small bubble of air along the wall of the bowel.  No lymphadenopathy or metastatic disease.  Several small subcentimeter lung nodules seen which are similar to lung cancer screening scan from March 2025. - Robotic assisted partial colectomy (splenic flexure) by Dr. Teresa on 02/01/2024 - Pathology: Moderately differentiated invasive adenocarcinoma tumor size 3 cm, no lymphovascular invasion.  Perineural invasion identified.  Margins negative.  0/14 lymph nodes positive.  pT3 pN0.  MMR preserved. - Preoperative CEA: 1.9 - Signatera test: Negative     2. [name] Independent of ADL's and IADL's. Tobacco use of 1 ppd since age 5. No asbestos exposure. No chemical exposures.  -Father had prostate cancer and lung cancer. Brother had liver cancer. Maternal aunt had breast cancer. Paternal uncle had brain brain cancer.    Cancer of splenic flexure (HCC)   02/25/2024 Initial Diagnosis   Cancer of splenic flexure (HCC)   02/25/2024 Cancer Staging   Staging form: Colon and Rectum, AJCC 8th Edition - Clinical stage from 02/25/2024: Stage IIA (cT3, cN0, cM0) - Signed by Rogers Hai, MD on 02/25/2024 Histopathologic type: Adenocarcinoma, NOS Stage prefix: Initial diagnosis Total positive nodes: 0 Total nodes examined: 14 Histologic grade (G): G2 Histologic grading system: 4 grade system Laterality: Left Tumor size (mm): 30 Lymph-vascular invasion (LVI): LVI not present (absent)/not identified Specimen type: Excision Carcinoembryonic antigen (CEA) (ng/mL): 1.9 Perineural invasion (PNI): Present Microsatellite instability (MSI): Stable KRAS mutation: Unknown NRAS mutation: Unknown BRAF mutation: Unknown      CBC    Component Value Date/Time   WBC 3.1 (L) 06/10/2024 1124   RBC 3.85 (L) 06/10/2024 1124   HGB 13.8 06/10/2024 1124   HCT 40.7 06/10/2024  1124   PLT 211 06/10/2024 1124   MCV 105.7 (H) 06/10/2024 1124   MCH 35.8 (H) 06/10/2024 1124   MCHC 33.9 06/10/2024 1124   RDW 14.0 06/10/2024 1124   LYMPHSABS 1.3 06/10/2024 1124   MONOABS 0.3 06/10/2024 1124   EOSABS 0.0 06/10/2024 1124   BASOSABS 0.0 06/10/2024 1124       Latest Ref Rng & Units 06/10/2024   11:24 AM 02/03/2024    4:44 AM 02/02/2024    5:38 AM  CMP  Glucose 70 - 99 mg/dL 893  94  91   BUN 8 - 23 mg/dL 10  9  11    Creatinine 0.44 - 1.00 mg/dL 9.22  9.37  9.20   Sodium 135 - 145 mmol/L 141  138  137   Potassium 3.5 - 5.1 mmol/L 3.7  3.4  3.7   Chloride 98 - 111 mmol/L 108  107  107   CO2 22 - 32 mmol/L 22  24  23    Calcium 8.9 - 10.3 mg/dL 9.8  9.0  8.6   Total Protein 6.5 - 8.1 g/dL 7.3     Total Bilirubin 0.0 - 1.2 mg/dL 0.6     Alkaline Phos 38 - 126 U/L 64     AST 15 - 41 U/L 18     ALT 0 - 44 U/L 10        Lab Results  Component Value Date   FERRITIN 251 06/10/2024   VITAMINB12 245 06/10/2024    Vitals:   06/17/24 1026 06/17/24 1030   BP: (!) 175/69 (!) 164/83  Pulse: (!) 55   Resp: 20   Temp: (!) 96.6 F (35.9 C)   SpO2: 100%     Review of System:  Review of Systems  Constitutional:  Positive for malaise/fatigue.  Gastrointestinal:  Positive for diarrhea.  Skin:  Positive for itching and rash.  Endo/Heme/Allergies:  Bruises/bleeds easily.    Physical Exam: Physical Exam Constitutional:      Appearance: Normal appearance.  HENT:     Head: Normocephalic and atraumatic.  Eyes:     Pupils: Pupils are equal, round, and reactive to light.  Cardiovascular:     Rate and Rhythm: Normal rate and regular rhythm.     Heart sounds: Normal heart sounds. No murmur heard. Pulmonary:     Effort: Pulmonary effort is normal.     Breath sounds: Normal breath sounds. No wheezing.  Abdominal:     General: Bowel sounds are normal. There is no distension.     Palpations: Abdomen is soft.     Tenderness: There is no abdominal tenderness.  Musculoskeletal:        General: Normal range of motion.     Cervical back: Normal range of motion.  Skin:    General: Skin is warm and dry.     Findings: No rash.  Neurological:     Mental Status: She is alert and oriented to person, place, and time.     Gait: Gait is intact.  Psychiatric:        Mood and Affect: Mood and affect normal.        Cognition and Memory: Memory normal.        Judgment: Judgment normal.      I spent 25 minutes dedicated to the care of this patient (face-to-face and non-face-to-face) on the date of the encounter to include what is described in the assessment and plan.,  Delon Hope, NP 06/17/2024 10:33 AM

## 2024-06-17 NOTE — Assessment & Plan Note (Addendum)
-   We discussed the Signatera test results which were negative.  We have requested MSI on the pathology.  I could not see the report. - She has 1 high risk feature of positive perineural invasion.  We discussed options for stage II high risk disease including observation/capecitabine (6 months)/CapeOx (3 months). - She chose the option of observation. -Most recent Signatera testing from 03/16/2024 was negative.  Repeat in 6 months. -Labs from 06/10/2024 show unremarkable CBC, CMP and CEA. -CT scan from 06/10/2024 did not reveal evidence of lymphadenopathy or metastatic disease in the chest abdomen or pelvis.  Stable small pulmonary nodules - We discussed surveillance plan which includes: Clinic visits every 3 months with CEA, LFTs, imaging and Signatera every 6 months for the first 2 years.  She will have colonoscopy in April 2026.  After the first 2 years, she will have clinic visits every 6 months and imaging once a year.

## 2024-06-17 NOTE — Assessment & Plan Note (Addendum)
 Most recent labs show no anemia.  Iron  saturation elevated at 63% low TIBC.  Hemoglobin normal.  Ferritin 251.  Do not take supplements.  Recheck iron levels in 3 months.

## 2024-06-17 NOTE — Progress Notes (Signed)
 Patient and patient's family taught on how to administered B12 injection. Patient and patient's family verbalized and demonstrated understanding on how to administer B12 injections at home. All questions answered at this time.     Patient tolerated B12 injection with no complaints voiced.  Site clean and dry with no bruising or swelling noted at site.  See MAR for details.  Band aid applied.  Patient stable during and after injection.  Vss with discharge and left in satisfactory condition with no s/s of distress noted. All follow ups as scheduled.   [facility]

## 2024-06-17 NOTE — Patient Instructions (Signed)
 [facility] - A DEPT OF [facility]  Discharge Instructions: Thank you for choosing [facility] to provide your oncology and hematology care.  If you have a lab appointment with the Cancer Center - please note that after April 8th, 2024, all labs will be drawn in the cancer center.  You do not have to check in or register with the main entrance as you have in the past but will complete your check-in in the cancer center.  Wear comfortable clothing and clothing appropriate for easy access to any Portacath or PICC line.   We strive to give you quality time with your provider. You may need to reschedule your appointment if you arrive late (15 or more minutes).  Arriving late affects you and other patients whose appointments are after yours.  Also, if you miss three or more appointments without notifying the office, you may be dismissed from the clinic at the provider's discretion.      For prescription refill requests, have your pharmacy contact our office and allow 72 hours for refills to be completed.    Today you received the following chemotherapy and/or immunotherapy agents B12 injection Vitamin B12 Injection What is this medication? Vitamin B12 (VAHY tuh min B12) prevents and treats low vitamin B12 levels in your body. It is used in people who do not get enough vitamin B12 from their diet or when their digestive tract does not absorb enough. Vitamin B12 plays an important role in maintaining the health of your nervous system and red blood cells. This medicine may be used for other purposes; ask your health care provider or pharmacist if you have questions. COMMON BRAND NAME(S): B-12 Compliance Kit, B-12 Injection Kit, Cyomin, Dodex, LA-12, Nutri-Twelve, Physicians EZ Use B-12, Primabalt, Vitamin Deficiency Injectable System - B12 What should I tell my care team before I take this medication? They need to know if you have any of these  conditions: Kidney disease Leber's disease Megaloblastic anemia An unusual or allergic reaction to cyanocobalamin, cobalt, other medications, foods, dyes, or preservatives Pregnant or trying to get pregnant Breast-feeding How should I use this medication? This medication is injected into a muscle or deeply under the skin. It is usually given in a clinic or care team's office. However, your care team may teach you how to inject yourself. Follow all instructions. Talk to your care team about the use of this medication in children. Special care may be needed. Overdosage: If you think you have taken too much of this medicine contact a poison control center or emergency room at once. NOTE: This medicine is only for you. Do not share this medicine with others. What if I miss a dose? If you are given your dose at a clinic or care team's office, call to reschedule your appointment. If you give your own injections, and you miss a dose, take it as soon as you can. If it is almost time for your next dose, take only that dose. Do not take double or extra doses. What may interact with this medication? Alcohol Colchicine This list may not describe all possible interactions. Give your health care provider a list of all the medicines, herbs, non-prescription drugs, or dietary supplements you use. Also tell them if you smoke, drink alcohol, or use illegal drugs. Some items may interact with your medicine. What should I watch for while using this medication? Visit your care team regularly. You may need blood work done while  you are taking this medication. You may need to follow a special diet. Talk to your care team. Limit your alcohol intake and avoid smoking to get the best benefit. What side effects may I notice from receiving this medication? Side effects that you should report to your care team as soon as possible: Allergic reactions--skin rash, itching, hives, swelling of the face, lips, tongue, or  throat Swelling of the ankles, hands, or feet Trouble breathing Side effects that usually do not require medical attention (report to your care team if they continue or are bothersome): Diarrhea This list may not describe all possible side effects. Call your doctor for medical advice about side effects. You may report side effects to FDA at 1-800-FDA-1088. Where should I keep my medication? Keep out of the reach of children. Store at room temperature between 15 and 30 degrees C (59 and 85 degrees F). Protect from light. Throw away any unused medication after the expiration date. NOTE: This sheet is a summary. It may not cover all possible information. If you have questions about this medicine, talk to your doctor, pharmacist, or health care provider.  2024 Elsevier/Gold Standard (2021-05-10 00:00:00)       To help prevent nausea and vomiting after your treatment, we encourage you to take your nausea medication as directed.  BELOW ARE SYMPTOMS THAT SHOULD BE REPORTED IMMEDIATELY: *FEVER GREATER THAN 100.4 F (38 C) OR HIGHER *CHILLS OR SWEATING *NAUSEA AND VOMITING THAT IS NOT CONTROLLED WITH YOUR NAUSEA MEDICATION *UNUSUAL SHORTNESS OF BREATH *UNUSUAL BRUISING OR BLEEDING *URINARY PROBLEMS (pain or burning when urinating, or frequent urination) *BOWEL PROBLEMS (unusual diarrhea, constipation, pain near the anus) TENDERNESS IN MOUTH AND THROAT WITH OR WITHOUT PRESENCE OF ULCERS (sore throat, sores in mouth, or a toothache) UNUSUAL RASH, SWELLING OR PAIN  UNUSUAL VAGINAL DISCHARGE OR ITCHING   Items with * indicate a potential emergency and should be followed up as soon as possible or go to the Emergency Department if any problems should occur.  Please show the CHEMOTHERAPY ALERT CARD or IMMUNOTHERAPY ALERT CARD at check-in to the Emergency Department and triage nurse.  Should you have questions after your visit or need to cancel or reschedule your appointment, please contact [facility]  CTR  - A DEPT OF [facility] [phone]  and follow the prompts.  Office hours are 8:00 a.m. to 4:30 p.m. Monday - Friday. Please note that voicemails left after 4:00 p.m. may not be returned until the following business day.  We are closed weekends and major holidays. You have access to a nurse at all times for urgent questions. Please call the main number to the clinic [phone] and follow the prompts.  For any non-urgent questions, you may also contact your provider using MyChart. We now offer e-Visits for anyone 68 and older to request care online for non-urgent symptoms. For details visit [email or web address].   Also download the MyChart app! Go to the app store, search "MyChart", open the app, select [place], and log in with your MyChart username and password.

## 2024-06-26 ENCOUNTER — Encounter: Payer: Self-pay | Admitting: Oncology

## 2024-06-26 NOTE — Assessment & Plan Note (Addendum)
 B12 levels are low.  She was previously taking B12 supplements OTC. We discussed starting B12 injections.  She would like to give these to herself at home. She will be given 1 in clinic and taught how to inject.  Prescription will also be sent to the pharmacy. Recommend weekly x 4 followed by monthly B12 injections.

## 2024-07-29 DIAGNOSIS — Z6826 Body mass index (BMI) 26.0-26.9, adult: Secondary | ICD-10-CM | POA: Diagnosis not present

## 2024-07-29 DIAGNOSIS — L309 Dermatitis, unspecified: Secondary | ICD-10-CM | POA: Diagnosis not present

## 2024-09-15 ENCOUNTER — Other Ambulatory Visit: Payer: Self-pay

## 2024-09-15 DIAGNOSIS — C185 Malignant neoplasm of splenic flexure: Secondary | ICD-10-CM

## 2024-09-17 ENCOUNTER — Inpatient Hospital Stay: Attending: Hematology

## 2024-09-17 ENCOUNTER — Encounter: Payer: Self-pay | Admitting: Oncology

## 2024-09-17 DIAGNOSIS — D508 Other iron deficiency anemias: Secondary | ICD-10-CM

## 2024-09-17 DIAGNOSIS — C185 Malignant neoplasm of splenic flexure: Secondary | ICD-10-CM

## 2024-09-17 LAB — CBC WITH DIFFERENTIAL/PLATELET
Abs Immature Granulocytes: 0.01 K/uL (ref 0.00–0.07)
Basophils Absolute: 0 K/uL (ref 0.0–0.1)
Basophils Relative: 0 %
Eosinophils Absolute: 0.1 K/uL (ref 0.0–0.5)
Eosinophils Relative: 2 %
HCT: 37.5 % (ref 36.0–46.0)
Hemoglobin: 12.7 g/dL (ref 12.0–15.0)
Immature Granulocytes: 0 %
Lymphocytes Relative: 39 %
Lymphs Abs: 1.8 K/uL (ref 0.7–4.0)
MCH: 36.3 pg — ABNORMAL HIGH (ref 26.0–34.0)
MCHC: 33.9 g/dL (ref 30.0–36.0)
MCV: 107.1 fL — ABNORMAL HIGH (ref 80.0–100.0)
Monocytes Absolute: 0.3 K/uL (ref 0.1–1.0)
Monocytes Relative: 7 %
Neutro Abs: 2.3 K/uL (ref 1.7–7.7)
Neutrophils Relative %: 52 %
Platelets: 221 K/uL (ref 150–400)
RBC: 3.5 MIL/uL — ABNORMAL LOW (ref 3.87–5.11)
RDW: 14 % (ref 11.5–15.5)
WBC: 4.5 K/uL (ref 4.0–10.5)
nRBC: 0 % (ref 0.0–0.2)

## 2024-09-17 LAB — COMPREHENSIVE METABOLIC PANEL WITH GFR
ALT: 10 U/L (ref 0–44)
AST: 18 U/L (ref 15–41)
Albumin: 4.3 g/dL (ref 3.5–5.0)
Alkaline Phosphatase: 56 U/L (ref 38–126)
Anion gap: 14 (ref 5–15)
BUN: 19 mg/dL (ref 8–23)
CO2: 20 mmol/L — ABNORMAL LOW (ref 22–32)
Calcium: 9.6 mg/dL (ref 8.9–10.3)
Chloride: 106 mmol/L (ref 98–111)
Creatinine, Ser: 0.83 mg/dL (ref 0.44–1.00)
GFR, Estimated: >60 mL/min
Glucose, Bld: 99 mg/dL (ref 70–99)
Potassium: 4.1 mmol/L (ref 3.5–5.1)
Sodium: 140 mmol/L (ref 135–145)
Total Bilirubin: 0.4 mg/dL (ref 0.0–1.2)
Total Protein: 7.1 g/dL (ref 6.5–8.1)

## 2024-09-18 LAB — CEA: CEA: 1.7 ng/mL (ref 0.0–4.7)

## 2024-09-22 LAB — SIGNATERA

## 2024-09-24 ENCOUNTER — Inpatient Hospital Stay

## 2024-09-24 ENCOUNTER — Inpatient Hospital Stay (HOSPITAL_BASED_OUTPATIENT_CLINIC_OR_DEPARTMENT_OTHER): Admitting: Oncology

## 2024-09-24 VITALS — BP 187/164 | HR 65 | Temp 97.8°F | Resp 16 | Wt 162.0 lb

## 2024-09-24 DIAGNOSIS — C185 Malignant neoplasm of splenic flexure: Secondary | ICD-10-CM | POA: Diagnosis not present

## 2024-09-24 DIAGNOSIS — E538 Deficiency of other specified B group vitamins: Secondary | ICD-10-CM | POA: Diagnosis not present

## 2024-09-24 DIAGNOSIS — D649 Anemia, unspecified: Secondary | ICD-10-CM | POA: Diagnosis not present

## 2024-09-24 NOTE — Progress Notes (Signed)
 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 -MSI stable.  No evidence of microsatellite instability by PCR. - She has 1 high risk feature of positive perineural invasion.  We discussed options for stage II high risk disease including observation/capecitabine (6 months)/CapeOx (3 months). - She chose the option of observation. -Patient is second-guessing observation and would like to discuss treatment options but would like to wait till after repeat colonoscopy which she is supposed to have in April 2026.  -Patient to call and arrange for repeat colonoscopy. -Most recent Signatera testing from 03/16/2024 was negative.  -Labs from 09/17/2024 showed normal CEA, CMP with macrocytosis.  Hemoglobin 12.7. -CT scan from 06/10/2024 did not reveal evidence of lymphadenopathy or metastatic disease in the chest abdomen or pelvis.  Stable small pulmonary nodules.   - We discussed surveillance plan which includes:  -Clinic visits every 3 months with CEA, LFTs. - Imaging (CT CAP) and Signatera every 6 months for the first 2 years-next CT scan due in March -After the first 2 years, she will have clinic visits every 6 months and imaging once a year. -Repeat labs (CEA, CMP and CBC) in 3 months.  Anemia, unspecified type Most recent labs show no anemia.   Iron levels from 06/10/2024 showed elevated iron saturation and ferritin levels. Recheck in 6 months. Vitamin B12 deficiency disease B12 level 06/10/2024 was 245. She was started on B12 injections which she gives to herself at home. Recheck B12 levels 6 months.   Orders Placed This Encounter  Procedures   CEA    Standing Status:   Future    Expected Date:   12/25/2024    Expiration Date:   03/25/2025   Comprehensive metabolic panel    Standing Status:   Future     Expected Date:   12/25/2024    Expiration Date:   03/25/2025   CBC with Differential    Standing Status:   Future    Expected Date:   12/25/2024    Expiration Date:   03/25/2025   CEA    Standing Status:   Future    Expected Date:   12/18/2024    Expiration Date:   03/19/2025   Ferritin    Standing Status:   Future    Expected Date:   12/18/2024    Expiration Date:   03/19/2025   Iron and TIBC ([facility] ONLY)    Standing Status:   Future    Expected Date:   12/18/2024    Expiration Date:   03/19/2025   Vitamin B12    Standing Status:   Future    Expected Date:   12/18/2024    Expiration Date:   03/19/2025   Folate    Standing Status:   Future    Expected Date:   12/18/2024    Expiration Date:   03/19/2025    INTERVAL HISTORY: Patient returns for follow-up for splenic flexure cancer.  She is here for follow-up.  She is accompanied by her sister.  Patient denies any hospitalizations, surgeries or changes to baseline health.  Reports overall doing well since her last visit.  Reports appetite is 100% energy levels are 80%.  She has occasional diarrhea and insomnia.  She has fatigue.  Her blood pressure has been elevated and her medications are being  manipulated by her PCP whom she saw yesterday.  She is currently on hydralazine  and recently added olmesartan.  Reports her PCP was upset with her for not further discussing treatment options following her last visit with Dr. Rogers.  Reports she is interested in further discussing after her repeat colonoscopy which will be due in April 2026.  This was performed by Dr. Cinderella and patient will be calling to get this scheduled in the near future.  She denies any melena, hematochezia or bright red blood per rectum.  Patient continues B12 injections at home.  She is tolerating them well.  We reviewed CT scan, CMP, CBC, ferritin, iron panel, B12, folate level.  SUMMARY OF HEMATOLOGIC HISTORY: Oncology History Overview Note  1.  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 - Pathology: Tubular adenoma in the cecal, ascending and hepatic flexure polyps.  Descending colon mass biopsy shows at least intramucosal adenocarcinoma. - CT CAP (01/03/2024): Slight wall thickening and stranding along the splenic flexure, proximal descending colon.  Small bubble of air along the wall of the bowel.  No lymphadenopathy or metastatic disease.  Several small subcentimeter lung nodules seen which are similar to lung cancer screening scan from March 2025. - Robotic assisted partial colectomy (splenic flexure) by Dr. Teresa on 02/01/2024 - Pathology: Moderately differentiated invasive adenocarcinoma tumor size 3 cm, no lymphovascular invasion.  Perineural invasion identified.  Margins negative.  0/14 lymph nodes positive.  pT3 pN0.  MMR preserved. - Preoperative CEA: 1.9 - Signatera test: Negative     2. [name] Independent of ADL's and IADL's. Tobacco use of 1 ppd since age 20. No asbestos exposure. No chemical exposures.  -Father had prostate cancer and lung cancer. Brother had liver cancer. Maternal aunt had breast cancer. Paternal uncle had brain brain cancer.    Cancer of splenic flexure (HCC)  02/25/2024 Initial Diagnosis   Cancer of splenic flexure (HCC)   02/25/2024 Cancer Staging   Staging form: Colon and Rectum, AJCC 8th Edition - Clinical stage from 02/25/2024: Stage IIA (cT3, cN0, cM0) - Signed by Rogers Hai, MD on 02/25/2024 Histopathologic type: Adenocarcinoma, NOS Stage prefix: Initial diagnosis Total positive nodes: 0 Total nodes examined: 14 Histologic grade (G): G2 Histologic grading system: 4 grade system Laterality: Left Tumor size (mm): 30 Lymph-vascular invasion (LVI): LVI not present (absent)/not  identified Specimen type: Excision Carcinoembryonic antigen (CEA) (ng/mL): 1.9 Perineural invasion (PNI): Present Microsatellite instability (MSI): Stable KRAS mutation: Unknown NRAS mutation: Unknown BRAF mutation: Unknown      CBC    Component Value Date/Time   WBC 4.5 09/17/2024 1355   RBC 3.50 (L) 09/17/2024 1355   HGB 12.7 09/17/2024 1355   HCT 37.5 09/17/2024 1355   PLT 221 09/17/2024 1355   MCV 107.1 (H) 09/17/2024 1355   MCH 36.3 (H) 09/17/2024 1355   MCHC 33.9 09/17/2024 1355   RDW 14.0 09/17/2024 1355   LYMPHSABS 1.8 09/17/2024 1355   MONOABS 0.3 09/17/2024 1355   EOSABS 0.1 09/17/2024 1355   BASOSABS 0.0 09/17/2024 1355       Latest Ref Rng & Units 09/17/2024    1:55 PM 06/10/2024   11:24 AM 02/03/2024    4:44 AM  CMP  Glucose 70 - 99 mg/dL 99  893  94   BUN 8 - 23 mg/dL 19  10  9  Creatinine 0.44 - 1.00 mg/dL 9.16  9.22  9.37   Sodium 135 - 145 mmol/L 140  141  138   Potassium 3.5 - 5.1 mmol/L 4.1  3.7  3.4   Chloride 98 - 111 mmol/L 106  108  107   CO2 22 - 32 mmol/L 20  22  24    Calcium 8.9 - 10.3 mg/dL 9.6  9.8  9.0   Total Protein 6.5 - 8.1 g/dL 7.1  7.3    Total Bilirubin 0.0 - 1.2 mg/dL 0.4  0.6    Alkaline Phos 38 - 126 U/L 56  64    AST 15 - 41 U/L 18  18    ALT 0 - 44 U/L 10  10       Lab Results  Component Value Date   FERRITIN 251 06/10/2024   VITAMINB12 245 06/10/2024    Vitals:   09/24/24 0955  BP: (!) 187/164  Pulse: 65  Resp: 16  Temp: 97.8 F (36.6 C)  SpO2: 93%     Review of System:  Review of Systems  Constitutional:  Positive for malaise/fatigue.  Gastrointestinal:  Positive for diarrhea.  Psychiatric/Behavioral:  The patient has insomnia.     Physical Exam: Physical Exam Constitutional:      Appearance: Normal appearance.  HENT:     Head: Normocephalic and atraumatic.  Eyes:     Pupils: Pupils are equal, round, and reactive to light.  Cardiovascular:     Rate and Rhythm: Normal rate and regular rhythm.      Heart sounds: Normal heart sounds. No murmur heard. Pulmonary:     Effort: Pulmonary effort is normal.     Breath sounds: Normal breath sounds. No wheezing.  Abdominal:     General: Bowel sounds are normal. There is no distension.     Palpations: Abdomen is soft.     Tenderness: There is no abdominal tenderness.  Musculoskeletal:        General: Normal range of motion.     Cervical back: Normal range of motion.  Skin:    General: Skin is warm and dry.     Findings: No rash.  Neurological:     Mental Status: She is alert and oriented to person, place, and time.     Gait: Gait is intact.  Psychiatric:        Mood and Affect: Mood and affect normal.        Cognition and Memory: Memory normal.        Judgment: Judgment normal.      I spent 25 minutes dedicated to the care of this patient (face-to-face and non-face-to-face) on the date of the encounter to include what is described in the assessment and plan.,  Delon Hope, NP 09/24/2024 10:08 AM "

## 2024-09-24 NOTE — Assessment & Plan Note (Addendum)
-   Signatera testing needs to be recollected but previous Signatera testing was negative. -MSI stable.  No evidence of microsatellite instability by PCR. - She has 1 high risk feature of positive perineural invasion.  We discussed options for stage II high risk disease including observation/capecitabine (6 months)/CapeOx (3 months). - She chose the option of observation. -Patient is second-guessing observation and would like to discuss treatment options but would like to wait till after repeat colonoscopy which she is supposed to have in April 2026.  -Patient to call and arrange for repeat colonoscopy. -Most recent Signatera testing from 03/16/2024 was negative.  -Labs from 09/17/2024 showed normal CEA, CMP with macrocytosis.  Hemoglobin 12.7. -CT scan from 06/10/2024 did not reveal evidence of lymphadenopathy or metastatic disease in the chest abdomen or pelvis.  Stable small pulmonary nodules.   - We discussed surveillance plan which includes:  -Clinic visits every 3 months with CEA, LFTs. - Imaging (CT CAP) and Signatera every 6 months for the first 2 years-next CT scan due in March -After the first 2 years, she will have clinic visits every 6 months and imaging once a year. -Repeat labs (CEA, CMP and CBC) in 3 months.

## 2024-09-24 NOTE — Assessment & Plan Note (Deleted)
 Most recent labs show no anemia.  Iron  saturation elevated at 63% low TIBC.  Hemoglobin normal.  Ferritin 251.  Do not take supplements.  Recheck iron levels in 3 months.

## 2024-09-24 NOTE — Progress Notes (Signed)
 Per Delon Hope patient may be doing injections at home.

## 2024-09-24 NOTE — Assessment & Plan Note (Addendum)
 B12 level 06/10/2024 was 245. She was started on B12 injections which she gives to herself at home. Recheck B12 levels 6 months.

## 2024-09-24 NOTE — Assessment & Plan Note (Addendum)
 Most recent labs show no anemia.   Iron levels from 06/10/2024 showed elevated iron saturation and ferritin levels. Recheck in 6 months.

## 2024-09-25 ENCOUNTER — Encounter: Payer: Self-pay | Admitting: Oncology

## 2024-09-30 ENCOUNTER — Encounter: Payer: Self-pay | Admitting: Oncology

## 2024-10-03 LAB — SIGNATERA
SIGNATERA MTM READOUT: 0 MTM/ml
SIGNATERA TEST RESULT: NEGATIVE

## 2024-12-16 ENCOUNTER — Inpatient Hospital Stay

## 2024-12-16 ENCOUNTER — Ambulatory Visit (HOSPITAL_COMMUNITY)

## 2024-12-23 ENCOUNTER — Inpatient Hospital Stay: Admitting: Oncology
